# Patient Record
Sex: Male | Born: 1995 | Race: White | Hispanic: No | Marital: Single | Smoking: Never smoker
Health system: Southern US, Community
[De-identification: ages and names within clinical notes are randomized; demographics above are authoritative.]

## PROBLEM LIST (undated history)

## (undated) DIAGNOSIS — R55 Syncope and collapse: Secondary | ICD-10-CM

## (undated) DIAGNOSIS — I340 Nonrheumatic mitral (valve) insufficiency: Secondary | ICD-10-CM

## (undated) DIAGNOSIS — I341 Nonrheumatic mitral (valve) prolapse: Principal | ICD-10-CM

## (undated) DIAGNOSIS — R011 Cardiac murmur, unspecified: Secondary | ICD-10-CM

## (undated) DIAGNOSIS — Q676 Pectus excavatum: Secondary | ICD-10-CM

## (undated) DIAGNOSIS — R636 Underweight: Secondary | ICD-10-CM

## (undated) HISTORY — DX: Nonrheumatic mitral (valve) prolapse: I34.1

## (undated) HISTORY — DX: Syncope and collapse: R55

## (undated) HISTORY — PX: NO PAST SURGERIES: SHX2092

## (undated) HISTORY — DX: Cardiac murmur, unspecified: R01.1

## (undated) HISTORY — DX: Pectus excavatum: Q67.6

## (undated) HISTORY — DX: Nonrheumatic mitral (valve) insufficiency: I34.0

## (undated) HISTORY — DX: Underweight: R63.6

## (undated) NOTE — *Deleted (*Deleted)
Preventive Care 21-39 Years Old, Male Preventive care refers to lifestyle choices and visits with your health care provider that can promote health and wellness. This includes:  A yearly physical exam. This is also called an annual well check.  Regular dental and eye exams.  Immunizations.  Screening for certain conditions.  Healthy lifestyle choices, such as eating a healthy diet, getting regular exercise, not using drugs or products that contain nicotine and tobacco, and limiting alcohol use. What can I expect for my preventive care visit? Physical exam Your health care provider will check:  Height and weight. These may be used to calculate body mass index (BMI), which is a measurement that tells if you are at a healthy weight.  Heart rate and blood pressure.  Your skin for abnormal spots. Counseling Your health care provider may ask you questions about:  Alcohol, tobacco, and drug use.  Emotional well-being.  Home and relationship well-being.  Sexual activity.  Eating habits.  Work and work environment. What immunizations do I need?  Influenza (flu) vaccine  This is recommended every year. Tetanus, diphtheria, and pertussis (Tdap) vaccine  You may need a Td booster every 10 years. Varicella (chickenpox) vaccine  You may need this vaccine if you have not already been vaccinated. Human papillomavirus (HPV) vaccine  If recommended by your health care provider, you may need three doses over 6 months. Measles, mumps, and rubella (MMR) vaccine  You may need at least one dose of MMR. You may also need a second dose. Meningococcal conjugate (MenACWY) vaccine  One dose is recommended if you are 19-21 years old and a first-year college student living in a residence hall, or if you have one of several medical conditions. You may also need additional booster doses. Pneumococcal conjugate (PCV13) vaccine  You may need this if you have certain conditions and were not  previously vaccinated. Pneumococcal polysaccharide (PPSV23) vaccine  You may need one or two doses if you smoke cigarettes or if you have certain conditions. Hepatitis A vaccine  You may need this if you have certain conditions or if you travel or work in places where you may be exposed to hepatitis A. Hepatitis B vaccine  You may need this if you have certain conditions or if you travel or work in places where you may be exposed to hepatitis B. Haemophilus influenzae type b (Hib) vaccine  You may need this if you have certain risk factors. You may receive vaccines as individual doses or as more than one vaccine together in one shot (combination vaccines). Talk with your health care provider about the risks and benefits of combination vaccines. What tests do I need? Blood tests  Lipid and cholesterol levels. These may be checked every 5 years starting at age 20.  Hepatitis C test.  Hepatitis B test. Screening   Diabetes screening. This is done by checking your blood sugar (glucose) after you have not eaten for a while (fasting).  Sexually transmitted disease (STD) testing. Talk with your health care provider about your test results, treatment options, and if necessary, the need for more tests. Follow these instructions at home: Eating and drinking   Eat a diet that includes fresh fruits and vegetables, whole grains, lean protein, and low-fat dairy products.  Take vitamin and mineral supplements as recommended by your health care provider.  Do not drink alcohol if your health care provider tells you not to drink.  If you drink alcohol: ? Limit how much you have to 0-2   drinks a day. ? Be aware of how much alcohol is in your drink. In the [place] one drink equals one 12 oz bottle of beer (355 mL), one 5 oz glass of wine (148 mL), or one 1 oz glass of hard liquor (44 mL). Lifestyle  Take daily care of your teeth and gums.  Stay active. Exercise for at least 30 minutes on 5 or  more days each week.  Do not use any products that contain nicotine or tobacco, such as cigarettes, e-cigarettes, and chewing tobacco. If you need help quitting, ask your health care provider.  If you are sexually active, practice safe sex. Use a condom or other form of protection to prevent STIs (sexually transmitted infections). What's next?  Go to your health care provider once a year for a well check visit.  Ask your health care provider how often you should have your eyes and teeth checked.  Stay up to date on all vaccines. This information is not intended to replace advice given to you by your health care provider. Make sure you discuss any questions you have with your health care provider. Document Revised: 04/04/2018 Document Reviewed: 04/04/2018 Elsevier Patient Education  2020 [employer]  

---
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
## 2016-02-22 ENCOUNTER — Ambulatory Visit (INDEPENDENT_AMBULATORY_CARE_PROVIDER_SITE_OTHER): Payer: 59 | Admitting: Family Medicine

## 2016-02-22 ENCOUNTER — Ambulatory Visit (INDEPENDENT_AMBULATORY_CARE_PROVIDER_SITE_OTHER): Payer: 59

## 2016-02-22 DIAGNOSIS — M25532 Pain in left wrist: Secondary | ICD-10-CM | POA: Diagnosis not present

## 2016-02-22 DIAGNOSIS — M778 Other enthesopathies, not elsewhere classified: Secondary | ICD-10-CM

## 2016-02-22 MED ORDER — DICLOFENAC SODIUM 1 % TD GEL: 2.0000 g | Freq: Four times a day (QID) | TRANSDERMAL | 11 refills | Status: DC

## 2016-02-22 NOTE — Patient Instructions (Signed)
Thank you for coming in today. Attend PT/OT.  Work on exercises.  Use a brace as needed.  Apply the gel as needed for pain.    Flexor Proofreader Radialis Tendinitis With Rehab Tendonitis is a condition that involves inflammation of a tendon, which is a soft tissue that connects muscle to bone. The flexor carpi ulnaris (FCU) and flexor carpi radialis (FCR) tendons are vulnerable to tendonitis. The FCU and FCR are used for bending the wrist and gripping. FCU and FCR tendonitis may include inflammation of the tendon lining (sheath). The lining normally secretes fluid that allows the tendons to function smoothly, but when it is inflamed, function is impaired. This condition may also include a tear in the FCU or FCR tendon or muscle (strain). The strain may be classified as grade 1 or 2. Grade 1 strains cause pain, but the tendon is not lengthened. Grade 2 strains include a lengthened ligament, due to the ligament being stretched or partially torn. With grade 2 strains, there is still function, although function may be decreased. SYMPTOMS   Pain, tenderness, swelling, warmth, or redness on the underside of the wrist.  Pain that gets worse when bending the wrist, especially against resistance or when turning the palm down against resistance.  Pain with gripping.  Limited motion of the wrist.  Crackling sound (crepitation) when the tendon or wrist is moved or touched.  Numbness in part of the palm of the hand. CAUSES  FCU and FCR tendonitis are caused by injury to the FCU and FCR tendons. This is often due to chronic or repeated injuries, but may also be due to severe (acute) injury. Common causes of injury include:   Strain from unusual use, overuse, increase in activity, or change in activity of the wrist, hand, or forearm.  Repeated motions of the hand and wrist, due to friction of the tendon within the lining (sheath). RISK INCREASES WITH:   Sports that involve repeated  hand and wrist motions (i.e. golfing, bowling).  Sports that require gripping (i.e. tennis, golf, weightlifting).  Heavy labor.  Poor wrist and forearm strength and flexibility.  Failure to warm up properly before activity.  This condition is more common in women than in men. PREVENTION   Warm up and stretch properly before activity.  Allow the body to recover between activity.  Maintain physical fitness:  Strength, flexibility, and endurance.  Cardiovascular fitness.  Learn and use proper technique. PROGNOSIS  If treated properly, FCU and FCR tendonitis is usually curable with 6 weeks.  RELATED COMPLICATIONS   Longer healing time, if not properly treated or if not given adequate time to heal.  Chronically inflamed tendon, causing persistent pain with activity, that may progress to constant pain, restriction of motion of the tendon within the sheath, and may potentially rupture the tendon.  Recurring symptoms, especially if activity is resumed too soon.  Risks of surgery: infection, bleeding, injury to nerves, continued pain, incomplete release of the tendon lining, recurring symptoms, cutting of the tendon, and weakness of the wrist and grip. TREATMENT  Treatment first involves the use of ice and medicine to reduce pain and inflammation Performing stretching and strengthening exercises regularly is important for a quick recovery. These exercises may be completed at home or with a therapist. Your caregiver may recommend the use of a brace or splint to reduce motions that aggravate symptoms. Corticosteroid injections may be recommended. If non-surgical treatment is unsuccessful, surgery may be necessary.  MEDICATION  If pain medicine is needed, nonsteroidal anti-inflammatory medicines (aspirin and ibuprofen), or other minor pain relievers (acetaminophen), are often advised.  Do not take pain medicine for 7 days before surgery.  Prescription pain relievers may be given if  your caregiver thinks they are needed. Use only as directed and only as much as you need.  Cortisone injections may be given, to reduce inflammation. However, these injections should be reserved for serious cases, as they may only be given a certain number of times. COLD THERAPY  Cold treatment (icing) relieves pain and reduces inflammation. Cold treatment should be applied for 10 to 15 minutes every 2 to 3 hours, and immediately after activity that aggravates your symptoms. Use ice packs or an ice massage. SEEK MEDICAL CARE IF:   Symptoms get worse or do not improve in 2 weeks, despite treatment.  You experience pain, numbness, or coldness in the hand.  Blue, gray, or dark color appears in the fingernails.  Any of the following occur after surgery: increased pain, swelling, redness, drainage of fluids, bleeding in the affected area, or signs of infection.  New, unexplained symptoms develop. (Drugs used in treatment may produce side effects.) EXERCISES  RANGE OF MOTION (ROM) AND STRETCHING EXERCISES - Flexor Carpi Ulnaris and Flexor Carpi Radialis Tendinitis These exercises may help you when beginning to recover from your injury. Your symptoms may go away with or without further involvement from your physician, physical therapist or athletic trainer. While completing these exercises, remember:   Restoring tissue flexibility helps normal motion to return to the joints. This allows healthier, less painful movement and activity.  An effective stretch should be held for at least 30 seconds.  A stretch should never be painful. You should only feel a gentle lengthening or release in the stretched tissue. RANGE OF MOTION - Wrist Flexion, Active-Assisted  Extend your right / left elbow with your fingers pointing down.*  Gently pull the back of your hand towards you, until you feel a gentle stretch on the top of your forearm.  Hold this position for __________ seconds. Repeat __________ times.  Complete this exercise __________ times per day.  *If directed by your physician, physical therapist or athletic trainer, complete this stretch with your elbow bent, rather than extended. RANGE OF MOTION - Wrist Extension, Active-Assisted  Extend your right / left elbow and turn your palm upwards.*  Gently pull your palm and fingertips back, so your wrist extends and your fingers point more toward the ground.  You should feel a gentle stretch on the inside of your forearm.  Hold this position for __________ seconds. Repeat __________ times. Complete this exercise __________ times per day. *If directed by your physician, physical therapist or athletic trainer, complete this stretch with your elbow bent, rather than extended. STRETCH - Wrist Flexion   Place the back of your right / left hand on a tabletop, leaving your elbow slightly bent. Your fingers should point away from your body.  Gently press the back of your hand down onto the table by straightening your elbow. You should feel a stretch on the top of your forearm.  Hold this position for __________ seconds. Repeat __________ times. Complete this stretch __________ times per day.  STRETCH - Wrist Extension  Place your right / left fingertips on a tabletop leaving your elbow slightly bent. Your fingers should point backwards.  Gently press your fingers and palm down onto the table by straightening your elbow. You should feel a stretch on the inside  of your forearm.  Hold this position for __________ seconds. Repeat __________ times. Complete this stretch __________ times per day.  STRENGTHENING EXERCISES - Flexor Carpi Ulnaris and Flexor Carpi Radialis Tendinitis These exercises may help you when beginning to recover from your injury. They may resolve your symptoms with or without further involvement from your physician, physical therapist or athletic trainer. While completing these exercises, remember:   Muscles can gain both the  endurance and the strength needed for everyday activities through controlled exercises.  Complete these exercises as instructed by your physician, physical therapist or athletic trainer. Increase the resistance and repetitions only as guided.  You may experience muscle soreness or fatigue, but the pain or discomfort you are trying to eliminate should never worsen during these exercises. If this pain does worsen, stop and make certain you are following the directions exactly. If the pain is still present after adjustments, discontinue the exercise until you can discuss the trouble with your clinician. STRENGTH - Wrist Flexors  Sit with your right / left forearm palm-up and fully supported on a table. Your elbow should be resting below the height of your shoulder. Allow your wrist to extend over the edge of the surface.  Loosely holding a __________ weight, or holding a piece of rubber exercise band or tubing in both hands, slowly curl your hand up toward your forearm.  Hold this position for __________ seconds. Slowly lower the wrist back to the starting position, in a controlled manner. Repeat __________ times. Complete this exercise __________ times per day.  STRENGTH - Wrist Extensors  Sit with your right / left forearm palm-down and fully supported on a table. Your elbow should be resting below the height of your shoulder Allow your wrist to extend over the edge of the surface.  Loosely holding a __________ weight, or holding a piece of rubber exercise band or tubing in both hands, slowly curl your hand up toward your forearm.  Hold this position for __________ seconds. Slowly lower the wrist back to the starting position, in a controlled manner. Repeat __________ times. Complete this exercise __________ times per day.  STRENGTH - Ulnar Deviators  Stand with a ____________________ weight in your right / left hand, or sit while holding onto a rubber exercise band or tubing in both hands,  with your healthy arm supported on a table, and your injured arm below the table.  Move only your right / left wrist, so that your pinkie travels toward your forearm and your thumb moves away from your forearm.  Hold this position for __________ seconds and then slowly lower the wrist back to the starting position. Repeat __________ times. Complete this exercise __________ times per day STRENGTH - Radial Deviators  Stand with a ____________________ weight in your right / left hand, or sit while holding onto a rubber exercise band or tubing in both hands, with your right / left arm supported on a table, and your healthy arm below the table.  Raise your right / left hand upward in front of you, so that your thumb moves toward your forearm.  Hold this position for __________ seconds and then slowly lower the wrist back to the starting position. Repeat __________ times. Complete this exercise __________ times per day. STRENGTH - Grip   Grasp a tennis ball, a dense sponge, or a large, rolled sock in your hand.  Squeeze as hard as you can, without increasing any pain.  Hold this position for __________ seconds. Release your grip slowly.  Repeat __________ times. Complete this exercise __________ times per day.    This information is not intended to replace advice given to you by your health care provider. Make sure you discuss any questions you have with your health care provider.   Document Released: 04/10/2005 Document Revised: 05/01/2014 Document Reviewed: 07/23/2008 Elsevier Interactive Patient Education [employer].

## 2016-02-23 NOTE — Progress Notes (Signed)
Eric Wright is a 20 y.o. male who [facility] today for wrist pain. 2 years ago patient was having any wrist mobility competition with his brother and with his wrist in a significantly flexed position felt a pop. Since then he has had pain in the ulnar side of his wrist off and on. Recently the pain has been worsening. A few days ago he was unable to carry a cup without pain. He notes ulnar wrist pain and occasional popping and catching. Catching is pretty rare. He has used over-the-counter medications and wrist bracing to moderate success in the past.    No past medical history on file. No past surgical history on file. Social History  Substance Use Topics  . Smoking status: Not on file  . Smokeless tobacco: Not on file  . Alcohol use Not on file     ROS:  As above No headache, visual changes, nausea, vomiting, diarrhea, constipation, dizziness, abdominal pain, skin rash, fevers, chills, night sweats, weight loss, swollen lymph nodes, body aches, joint swelling, muscle aches, chest pain, shortness of breath, mood changes, visual or auditory hallucinations.    Medications: Current Outpatient Prescriptions  Medication Sig Dispense Refill  . diclofenac sodium (VOLTAREN) 1 % GEL Apply 2 g topically 4 (four) times daily. To affected joint. 100 g 11   No current facility-administered medications for this visit.    Not on File   Exam:  BP 109/64   Pulse (!) 102   Wt 116 lb (52.6 kg)  General: Well Developed, well nourished, and in no acute distress.  Neuro/Psych: Alert and oriented x3, extra-ocular muscles intact, able to move all 4 extremities, sensation grossly intact. Skin: Warm and dry, no rashes noted.  Respiratory: Not using accessory muscles, speaking in full sentences, trachea midline.  Cardiovascular: Pulses palpable, no extremity edema. Abdomen: Does not appear distended. MSK: Wrist Is  normal-appearing. Mildly tender at the ulnar aspect of the wrist. Normal wrist motion. Pain with ulnar deviation and resisted flexion and extension with ulnar deviation. Pulses capillary Refill sensation and grip strength intact.    No results found for this or any previous visit (from the past 48 hour(s)). Dg Wrist Complete Left  Result Date: 02/22/2016 CLINICAL DATA:  LEFT wrist pain for 1.5 weeks, ulnar side, no known injury EXAM: LEFT WRIST - COMPLETE 3+ VIEW COMPARISON:  Non FINDINGS: Osseous mineralization normal. Joint spaces preserved. No fracture, dislocation, or bone destruction. IMPRESSION: Normal exam. Electronically Signed   By: Ulyses SouthwardMark  Boles M.D.   On: 02/22/2016 17:33      Assessment and Plan: 20 y.o. male with wrist pain due to old injury. I'm concerned for TFCC injury versus tendinitis or subluxation of the flexor carpi ulnaris or extensor carpi ulnaris tendons. Additionally a radiographically occult bony injury could also be present. Plan for trial of diclofenac gel and hand therapy. Recheck in 4-6 weeks. If no better would proceed with MRI arthrogram.    Orders Placed This Encounter  Procedures  . DG Wrist Complete Left    Standing Status:   Future    Number of Occurrences:   1    Standing Expiration Date:   04/23/2017    Order Specific Question:   Reason for Exam (SYMPTOM  OR DIAGNOSIS REQUIRED)    Answer:   eval ulnar wrist pain    Order Specific Question:   Preferred imaging location?    [facility]  . Ambulatory referral  to Occupational Therapy    Referral Priority:   Routine    Referral Type:   Occupational Therapy    Referral Reason:   Specialty Services Required    Requested Specialty:   Occupational Therapy    Number of Visits Requested:   1  . Ambulatory referral to Physical Therapy    Referral Priority:   Routine    Referral Type:   Physical Medicine    Referral Reason:   Specialty Services Required    Requested Specialty:    Physical Therapy    Number of Visits Requested:   1    Discussed warning signs or symptoms. Please see discharge instructions. Patient expresses understanding.

## 2016-07-27 ENCOUNTER — Encounter: Payer: Self-pay | Admitting: Physician Assistant

## 2016-07-27 ENCOUNTER — Ambulatory Visit (INDEPENDENT_AMBULATORY_CARE_PROVIDER_SITE_OTHER): Payer: BLUE CROSS/BLUE SHIELD

## 2016-07-27 ENCOUNTER — Ambulatory Visit (INDEPENDENT_AMBULATORY_CARE_PROVIDER_SITE_OTHER): Payer: BLUE CROSS/BLUE SHIELD | Admitting: Physician Assistant

## 2016-07-27 VITALS — BP 121/68 | HR 99 | Temp 98.0°F | Ht 70.0 in | Wt 114.0 lb

## 2016-07-27 DIAGNOSIS — R011 Cardiac murmur, unspecified: Secondary | ICD-10-CM

## 2016-07-27 DIAGNOSIS — R636 Underweight: Secondary | ICD-10-CM

## 2016-07-27 DIAGNOSIS — Q676 Pectus excavatum: Secondary | ICD-10-CM

## 2016-07-27 DIAGNOSIS — J069 Acute upper respiratory infection, unspecified: Secondary | ICD-10-CM

## 2016-07-27 DIAGNOSIS — R05 Cough: Secondary | ICD-10-CM | POA: Diagnosis not present

## 2016-07-27 HISTORY — DX: Pectus excavatum: Q67.6

## 2016-07-27 HISTORY — DX: Underweight: R63.6

## 2016-07-27 LAB — COMPREHENSIVE METABOLIC PANEL
ALK PHOS: 81 U/L (ref 40–115)
ALT: 35 U/L (ref 9–46)
AST: 30 U/L (ref 10–40)
Albumin: 4.2 g/dL (ref 3.6–5.1)
BILIRUBIN TOTAL: 0.5 mg/dL (ref 0.2–1.2)
BUN: 11 mg/dL (ref 7–25)
CO2: 30 mmol/L (ref 20–31)
CREATININE: 0.83 mg/dL (ref 0.60–1.35)
Calcium: 9.2 mg/dL (ref 8.6–10.3)
Chloride: 101 mmol/L (ref 98–110)
GLUCOSE: 84 mg/dL (ref 65–99)
Potassium: 4.3 mmol/L (ref 3.5–5.3)
Sodium: 137 mmol/L (ref 135–146)
TOTAL PROTEIN: 7.7 g/dL (ref 6.1–8.1)

## 2016-07-27 LAB — CBC
HCT: 44.9 % (ref 38.5–50.0)
Hemoglobin: 15.8 g/dL (ref 13.2–17.1)
MCH: 30.7 pg (ref 27.0–33.0)
MCHC: 35.2 g/dL (ref 32.0–36.0)
MCV: 87.4 fL (ref 80.0–100.0)
MPV: 9 fL (ref 7.5–12.5)
PLATELETS: 293 10*3/uL (ref 140–400)
RBC: 5.14 MIL/uL (ref 4.20–5.80)
RDW: 12.8 % (ref 11.0–15.0)
WBC: 12.3 10*3/uL — AB (ref 3.8–10.8)

## 2016-07-27 LAB — TSH: TSH: 1.44 mIU/L (ref 0.40–4.50)

## 2016-07-27 MED ORDER — FLUTICASONE PROPIONATE 50 MCG/ACT NA SUSP: 2.0000 | Freq: Every day | NASAL | 1 refills | Status: DC

## 2016-07-27 MED ORDER — DOXYCYCLINE HYCLATE 100 MG PO TABS: 100.0000 mg | ORAL_TABLET | Freq: Two times a day (BID) | ORAL | 0 refills | Status: AC

## 2016-07-27 NOTE — Patient Instructions (Addendum)
-   Go downstairs for chest xray and labs  - Take antibiotic with food twice a day for 7 days - Use nasal spray twice a day for 7 days. Continue use as needed for nasal congestion and ear discomfort  To use your nasal spray: - tilt head back - block one nostril - aim away from your nasal septum - dispense and gently inhale (DO NOT SNIFF) - wait 5 seconds - repeat on other side   - They will call you to schedule your ECHO of your heart

## 2016-07-27 NOTE — Progress Notes (Signed)
HPI:                                                                Eric Wright is a 21 y.o. male who presents to [facility]: Primary Care Sports Medicine today to establish care   Current Concerns include sinus congestion and cough  Patient reports sinus congestion and cough x 1 month. Cough is productive of thick, yellow mucus. Denies fever, chills, malaise, SOB, DOE, wheezing. He has been taking Mucinex. Denies history of asthma or allergies. Nonsmoker.  Health Maintenance Health Maintenance  Topic Date Due  . TETANUS/TDAP  07/12/2014  . INFLUENZA VACCINE  11/22/2016  . HIV Screening  Completed     Health Habits  Diet: good  Exercise: boxing x 2-3 h  ETOH: none  Tobacco: none  Drugs: none  Past Medical History:  Diagnosis Date  . Heart murmur    Past Surgical History:  Procedure Laterality Date  . NO PAST SURGERIES     Social History  Substance Use Topics  . Smoking status: Never Smoker  . Smokeless tobacco: Never Used  . Alcohol use Yes     Comment: 1-2 drinks per month   family history includes Asthma in his brother.  ROS: negative except as noted in the HPI  Medications: Current Outpatient Prescriptions  Medication Sig Dispense Refill  . doxycycline (VIBRA-TABS) 100 MG tablet Take 1 tablet (100 mg total) by mouth 2 (two) times daily. 14 tablet 0  . fluticasone (FLONASE) 50 MCG/ACT nasal spray Place 2 sprays into both nostrils daily. 1 g 1   No current facility-administered medications for this visit.    No Known Allergies     Objective:  BP 121/68   Pulse 99   Temp 98 F (36.7 C) (Oral)   Ht  (1.778 m)   Wt 114 lb (51.7 kg)   SpO2 97%   BMI 16.36 kg/m  Gen: well-groomed, cooperative, not ill-appearing, no distress HEENT: normal conjunctiva, left TM retracted, oropharynx with thick, yellow post-nasal secretions, moist mucus membranes, there is cervical adenopathy Pulm: Normal work of breathing,  normal phonation, lungs clear to auscultation bilaterally, no wheezes, rales or rhonchi CV: chest wall concave, normal rate, regular rhythm, s1 and s2 distinct, grade 3/6 systolic murmur appreciated best in the left 3-4 ICS without radiation, there is a precordial heave Neuro: alert and oriented x 3, EOM's intact, normal tone, no tremor MSK: moving all extremities, normal gait and station, no peripheral edema Skin: warm and dry, no rashes or lesions on exposed skin Psych: normal affect, euthymic mood, normal speech and thought content  Depression screen [facility] 2/9 07/31/2016  Decreased Interest 0  Down, Depressed, Hopeless 0  PHQ - 2 Score 0     Assessment and Plan: 21 y.o. male with   1. Congenital pectus excavatum - DG Chest 2View  2. Acute upper respiratory infection of multiple sites - DG Chest 2 View - doxycycline (VIBRA-TABS) 100 MG tablet; Take 1 tablet (100 mg total) by mouth 2 (two) times daily.  Dispense: 14 tablet; Refill: 0 - fluticasone (FLONASE) 50 MCG/ACT nasal spray; Place 2 sprays into both nostrils daily.  Dispense: 1 g; Refill: 1  3. Underweight, Encounter for preventive care - CBC, CMP, TSH,  Vit D, HIV antibody  4. Systolic ejection murmur - Echocardiogram pending - I reviewed patient's records in Care Everywhere. Patient was evaluated by peds cardiology in 2012 for syncope and abnormal ECG. 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 Given that patient has likely had a growth spurt since that time, will repeat ECHO with his adult anatomy to ensure so worsening regurgitation  Patient education and anticipatory guidance given Patient agrees with treatment plan Follow-up as needed if symptoms worsen or fail to improve  Levonne Hubert PA-C

## 2016-07-28 LAB — HIV ANTIBODY (ROUTINE TESTING W REFLEX): HIV 1&2 Ab, 4th Generation: NONREACTIVE

## 2016-07-28 LAB — VITAMIN D 25 HYDROXY (VIT D DEFICIENCY, FRACTURES): Vit D, 25-Hydroxy: 28 ng/mL — ABNORMAL LOW (ref 30–100)

## 2016-07-31 ENCOUNTER — Encounter: Payer: Self-pay | Admitting: Physician Assistant

## 2016-07-31 DIAGNOSIS — R55 Syncope and collapse: Secondary | ICD-10-CM | POA: Insufficient documentation

## 2016-07-31 HISTORY — DX: Syncope and collapse: R55

## 2016-08-10 ENCOUNTER — Ambulatory Visit (HOSPITAL_BASED_OUTPATIENT_CLINIC_OR_DEPARTMENT_OTHER)
Admission: RE | Admit: 2016-08-10 | Discharge: 2016-08-10 | Disposition: A | Discharge status: Home / Self Care | Payer: BLUE CROSS/BLUE SHIELD | Source: Ambulatory Visit | Attending: Physician Assistant | Admitting: Physician Assistant

## 2016-08-10 DIAGNOSIS — R011 Cardiac murmur, unspecified: Secondary | ICD-10-CM | POA: Insufficient documentation

## 2016-08-10 NOTE — Progress Notes (Signed)
  Echocardiogram 2D Echocardiogram has been performed.  Leta Jungling M 08/10/2016, 11:07 AM

## 2016-08-11 ENCOUNTER — Other Ambulatory Visit: Payer: Self-pay | Admitting: Physician Assistant

## 2016-08-11 DIAGNOSIS — I340 Nonrheumatic mitral (valve) insufficiency: Secondary | ICD-10-CM | POA: Insufficient documentation

## 2016-08-11 DIAGNOSIS — I341 Nonrheumatic mitral (valve) prolapse: Secondary | ICD-10-CM | POA: Insufficient documentation

## 2016-08-11 DIAGNOSIS — R931 Abnormal findings on diagnostic imaging of heart and coronary circulation: Secondary | ICD-10-CM | POA: Insufficient documentation

## 2016-08-11 HISTORY — DX: Nonrheumatic mitral (valve) prolapse: I34.1

## 2016-08-11 HISTORY — DX: Nonrheumatic mitral (valve) insufficiency: I34.0

## 2016-08-11 NOTE — Progress Notes (Signed)
Referring to Cardiology for abnormal echocardiogram showing mild MR with mildly elevated pulmonary arterial pressure (31 mmHg) and a trivial pericardial effusion

## 2016-09-08 NOTE — Progress Notes (Deleted)
    Referring-Cummings, Bernerd Phoharley Elizabeth, PA-C Reason for referral-MVP  HPI: 21 year old male for evaluation of mitral valve prolapse at request of Carlis StableCharley Elizabeth Cummings, PA-C. [facility] normal LV function and mild tricuspid regurgitation. Echocardiogram April 2018 showed normal LV systolic function, moderate prolapse of the anterior mitral valve leaflet with mild mitral regurgitation.  Current Outpatient Prescriptions  Medication Sig Dispense Refill  . fluticasone (FLONASE) 50 MCG/ACT nasal spray Place 2 sprays into both nostrils daily. 1 g 1   No current facility-administered medications for this visit.     No Known Allergies  Past Medical History:  Diagnosis Date  . Heart murmur     Past Surgical History:  Procedure Laterality Date  . NO PAST SURGERIES      Social History   Social History  . Marital status: Single    Spouse name: N/A  . Number of children: N/A  . Years of education: N/A   Occupational History  . Not on file.   Social History Main Topics  . Smoking status: Never Smoker  . Smokeless tobacco: Never Used  . Alcohol use Yes     Comment: 1-2 drinks per month  . Drug use: No  . Sexual activity: Yes   Other Topics Concern  . Not on file   Social History Narrative  . No narrative on file    Family History  Problem Relation Age of Onset  . Asthma Brother     ROS: no fevers or chills, productive cough, hemoptysis, dysphasia, odynophagia, melena, hematochezia, dysuria, hematuria, rash, seizure activity, orthopnea, PND, pedal edema, claudication. Remaining systems are negative.  Physical Exam:   There were no vitals taken for this visit.  General:  Well developed/well nourished in NAD Skin warm/dry Patient not depressed No peripheral clubbing Back-normal HEENT-normal/normal eyelids Neck supple/normal carotid upstroke bilaterally; no bruits; no JVD; no thyromegaly chest - CTA/ normal expansion CV -  RRR/normal S1 and S2; no murmurs, rubs or gallops;  PMI nondisplaced Abdomen -NT/ND, no HSM, no mass, + bowel sounds, no bruit 2+ femoral pulses, no bruits Ext-no edema, chords, 2+ DP Neuro-grossly nonfocal  ECG - personally reviewed  A/P  1  Olga MillersBrian Crenshaw, MD

## 2016-09-13 ENCOUNTER — Ambulatory Visit: Payer: BLUE CROSS/BLUE SHIELD | Admitting: Cardiology

## 2016-11-09 ENCOUNTER — Ambulatory Visit (INDEPENDENT_AMBULATORY_CARE_PROVIDER_SITE_OTHER): Payer: BLUE CROSS/BLUE SHIELD | Admitting: Physician Assistant

## 2016-11-09 ENCOUNTER — Encounter: Payer: Self-pay | Admitting: Physician Assistant

## 2016-11-09 VITALS — BP 119/73 | HR 68 | Wt 115.0 lb

## 2016-11-09 DIAGNOSIS — I341 Nonrheumatic mitral (valve) prolapse: Secondary | ICD-10-CM | POA: Diagnosis not present

## 2016-11-09 DIAGNOSIS — I340 Nonrheumatic mitral (valve) insufficiency: Secondary | ICD-10-CM

## 2016-11-09 NOTE — Progress Notes (Signed)
HPI:                                                                Eric Wright is a 21 y.o. male who [facility] today for follow-up of heart murmur  Patient presents today to review his recent Chest X-ray and Echocardiogram. Echo showed MVP with mild mitral regurgitation. Pulmonary pressure was also mildly elevated at 31. Patient was referred to Cardiology, but he missed his appointment. He is currently asymptomatic. Denies palpitations, irregular heart beat or shortness of breath.   Past Medical History:  Diagnosis Date  . 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  . Heart murmur   . Mild mitral regurgitation by prior echocardiogram 08/11/2016  . Mitral valve prolapse determined by imaging 08/11/2016  . Underweight 07/27/2016  . Vasovagal syncope 07/31/2016   Past Surgical History:  Procedure Laterality Date  . NO PAST SURGERIES     Social History  Substance Use Topics  . Smoking status: Never Smoker  . Smokeless tobacco: Never Used  . Alcohol use Yes     Comment: 1-2 drinks per month   family history includes Asthma in his brother.  ROS: negative except as noted in the HPI  Medications: No current outpatient prescriptions on file.   No current facility-administered medications for this visit.    No Known Allergies     Objective:  BP 119/73   Pulse 68   Wt 115 lb (52.2 kg)   BMI 16.50 kg/m  Gen: well-groomed, cooperative, not ill-appearing, no distress HEENT: normal conjunctiva, trachea midline Pulm: Normal work of breathing, normal phonation, clear to auscultation bilaterally, no wheezes, rales or rhonchi CV: chest wall with pectus excavatum, normal rate, regular rhythm, s1 and s2 distinct, grade 3/6 systolic murmur appreciated best in the left 3-4 ICS without radiation, there is a precordial heave Neuro: alert and oriented x 3, EOM's intact, no tremor MSK: extremities atraumatic, normal gait and  station, no peripheral edema Skin: warm, dry, intact; no rashes on exposed skin, no cyanosis   No results found for this or any previous visit (from the past 72 hour(s)). No results found.    Assessment and Plan: 21 y.o. male with   1. Mitral valve prolapse determined by imaging - asymptomatic - explained the physiology of MVP to patient and answer - Ambulatory referral to Cardiology  2. Mild mitral regurgitation by prior echocardiogram - discussed that this will likely require surveillance echo in 3-5 years - Ambulatory referral to Cardiology  Patient education and anticipatory guidance given Patient agrees with treatment plan Follow-up as needed if symptoms worsen or fail to improve  Levonne Hubertharley E. Cummings PA-C

## 2016-11-09 NOTE — Patient Instructions (Signed)
Mitral Valve Prolapse Mitral valve prolapse is a heart condition involving the mitral valve. This is the valve between the upper chamber (atrium) and the lower chamber (ventricle) on the left side of the heart. Normally, the mitral valve allows blood to flow from the atrium to the ventricle and then seals off the chambers from one another. If you have mitral valve prolapse, the valve does not work the way that it should. The flaps of the mitral valve do not form a tight seal between the atrium and the ventricle. When this happens, blood can flow the wrong way (mitral valve regurgitation). This causes symptoms of mitral valve prolapse. This condition can develop if:  The mitral valve flaps are larger and thicker than normal.  The valve opening stretches abnormally.  The valve flaps flop or bulge more than they should.  What are the causes? The cause of this condition is not known. However:  It is sometimes passed down (inherited) from a family member who also had the condition.  It can be a complication of other diseases.  What increases the risk? This condition is more like to develop in people with:  A family history of mitral valve prolapse.  Muscular dystrophy.  Graves disease.  Scoliosis.  A connective tissue disorder.  What are the signs or symptoms? Symptoms of this condition include:  A fast or irregular heartbeat (palpitations).  Fatigue.  Dizziness.  Shortness of breath.  Discomfort in the chest area.  In some cases, there are no symptoms for this condition. How is this diagnosed? This condition may be diagnosed based on:  Your symptoms and medical history.  A physical exam, which includes listening to your heart with a stethoscope.  Other tests to confirm the diagnosis. These may include imaging studies of your heart, such as: ? X-rays. These check for fluid in the lungs. ? Echocardiogram. This test uses sound waves to show the size of your heart and  how well it pumps. ? Doppler ultrasound. This test uses sound waves to take pictures of the blood flow through your valve. ? Electrocardiogram (ECG). This test records the electrical activity of your heart.  How is this treated? Treatment for this condition depends on how severe your symptoms are. If you need treatment, the goal is to relieve symptoms and prevent further problems, such as a type of heart infection called endocarditis. Treatment can include:  Medicines. You may need to take: ? Beta blockers. These help with chest discomfort and palpitations. ? Vasodilators. These improve forward blood flow through the valve, if it is leaking. ? Water pills (diuretics). These get rid of any extra fluid that is present.  Surgery to repair or replace the mitral valve.  If you do not have symptoms, you may not need treatment. Follow these instructions at home:  Take over-the-counter and prescription medicines only as told by your health care provider.  Get regular exercise. Ask your health care provider to recommend some activities that are safe for you to do.  Do not use any products that contain nicotine or tobacco, such as cigarettes and e-cigarettes. If you need help quitting, ask your health care provider.  Avoid being around secondhand smoke.  Brush and floss your teeth every day. See a dentist regularly. Having unhealthy teeth and gums may make endocarditis more likely.  Keep all follow-up visits as told by your health care provider. This is important. Contact a health care provider if:  You have any kind of abnormal heartbeat.    You are often very tired.  You have a cough that will not go away.  Your symptoms of mitral valve prolapse begin to get worse. Get help right away if:  You have chest pain or shortness of breath.  You start to have chills, body aches, and a fever. This information is not intended to replace advice given to you by your health care provider. Make  sure you discuss any questions you have with your health care provider. Document Released: 04/07/2000 Document Revised: 12/07/2015 Document Reviewed: 10/02/2015 Elsevier Interactive Patient Education  2017 Elsevier Inc[employer]  

## 2017-01-05 ENCOUNTER — Encounter: Payer: Self-pay | Admitting: Cardiology

## 2017-01-05 NOTE — Progress Notes (Deleted)
    Referring-Charley Doran Heater PA-C Reason for referral-abnormal echocardiogram/mitral valve prolapse  HPI: 21 year old male for evaluation of abnormal echocardiogram/mitral valve prolapse at request of Carlis Stable PA-C. Echocardiogram April 2018 showed normal LV function, moderate prolapse of anterior mitral valve leaflet with mild mitral regurgitation and mildly elevated pulmonary pressure.  No current outpatient prescriptions on file.   No current facility-administered medications for this visit.     No Known Allergies  Past Medical History:  Diagnosis Date  . 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  . Heart murmur   . Mild mitral regurgitation by prior echocardiogram 08/11/2016  . Mitral valve prolapse determined by imaging 08/11/2016  . Underweight 07/27/2016  . Vasovagal syncope 07/31/2016    Past Surgical History:  Procedure Laterality Date  . NO PAST SURGERIES      Social History   Social History  . Marital status: Single    Spouse name: N/A  . Number of children: N/A  . Years of education: N/A   Occupational History  . Not on file.   Social History Main Topics  . Smoking status: Never Smoker  . Smokeless tobacco: Never Used  . Alcohol use Yes     Comment: 1-2 drinks per month  . Drug use: No  . Sexual activity: Yes   Other Topics Concern  . Not on file   Social History Narrative  . No narrative on file    Family History  Problem Relation Age of Onset  . Asthma Brother     ROS: no fevers or chills, productive cough, hemoptysis, dysphasia, odynophagia, melena, hematochezia, dysuria, hematuria, rash, seizure activity, orthopnea, PND, pedal edema, claudication. Remaining systems are negative.  Physical Exam:   There were no vitals taken for this visit.  General:  Well developed/well nourished in NAD Skin warm/dry Patient not depressed No peripheral clubbing Back-normal HEENT-normal/normal eyelids Neck supple/normal carotid  upstroke bilaterally; no bruits; no JVD; no thyromegaly chest - CTA/ normal expansion CV - RRR/normal S1 and S2; no murmurs, rubs or gallops;  PMI nondisplaced Abdomen -NT/ND, no HSM, no mass, + bowel sounds, no bruit 2+ femoral pulses, no bruits Ext-no edema, chords, 2+ DP Neuro-grossly nonfocal  ECG - personally reviewed  A/P  1  Olga Millers, MD

## 2017-01-17 ENCOUNTER — Ambulatory Visit: Payer: BLUE CROSS/BLUE SHIELD | Admitting: Cardiology

## 2017-02-08 NOTE — Progress Notes (Signed)
    Referring-Cummings, Bernerd Phoharley Elizabeth, PA-C Reason for referral-MVR/MR  HPI: 21 year old male for evaluation of mitral valve prolapse/mitral regurgitation at request of Mardelle MatteCharlie Elizabeth Cummings PA-C. Echo 4/18 showed normal LV function, prolapse of anterior MV leaflet with mild MR; mildly elevated pulmonary pressure. Patient denies dyspnea, chest pain or recent syncope. He did have syncopal episodes in the remote past but not since 7 grade Occasional brief palpitations described as a skip. Because of the above we were asked to evaluate.  No current outpatient prescriptions on file.   No current facility-administered medications for this visit.     No Known Allergies   Past Medical History:  Diagnosis Date  . 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  . Heart murmur   . Mild mitral regurgitation by prior echocardiogram 08/11/2016  . Mitral valve prolapse determined by imaging 08/11/2016  . Underweight 07/27/2016  . Vasovagal syncope 07/31/2016    Past Surgical History:  Procedure Laterality Date  . NO PAST SURGERIES      Social History   Social History  . Marital status: Single    Spouse name: N/A  . Number of children: N/A  . Years of education: N/A   Occupational History  .      Works at retirement home   Social History Main Topics  . Smoking status: Never Smoker  . Smokeless tobacco: Never Used  . Alcohol use Yes     Comment: 1-2 drinks per month  . Drug use: No  . Sexual activity: Yes   Other Topics Concern  . Not on file   Social History Narrative  . No narrative on file    Family History  Problem Relation Age of Onset  . Asthma Brother     ROS: no fevers or chills, productive cough, hemoptysis, dysphasia, odynophagia, melena, hematochezia, dysuria, hematuria, rash, seizure activity, orthopnea, PND, pedal edema, claudication. Remaining systems are negative.  Physical Exam:   Blood pressure 102/66, pulse 77, height 5\' 10"  (1.778 m), weight 117 lb (53.1  kg).  General:  Well developed/well nourished in NAD Skin warm/dry Patient not depressed No peripheral clubbing Back-normal HEENT-normal/normal eyelids Neck supple/normal carotid upstroke bilaterally; no bruits; no JVD; no thyromegaly chest - CTA/ normal expansion; pectus excavatum CV - RRR/normal S1 and S2; no rubs or gallops;  PMI nondisplaced; 2/6 systolic murmur LSB Abdomen -NT/ND, no HSM, no mass, + bowel sounds, no bruit 2+ femoral pulses, no bruits Ext-no edema, chords, 2+ DP Neuro-grossly nonfocal  ECG - sinus rhythm at a rate of 77. Right axis deviation. No ST changes. personally reviewed  A/P  1 mitral valve prolapse/mitral regurgitation-patient has mild mitral regurgitation and mitral valve prolapse on prior echocardiogram. I reviewed this issue with him in detail today to clinic. We will plan follow-up echocardiogram in 3-5 years or if symptoms change.  2 remote history of syncope-none since seventh grade. No further evaluation.  3 history of pectus excavatum  Olga MillersBrian Roger Kettles, MD

## 2017-02-13 ENCOUNTER — Encounter: Payer: Self-pay | Admitting: Cardiology

## 2017-02-13 ENCOUNTER — Ambulatory Visit (INDEPENDENT_AMBULATORY_CARE_PROVIDER_SITE_OTHER): Payer: BLUE CROSS/BLUE SHIELD | Admitting: Cardiology

## 2017-02-13 VITALS — BP 102/66 | HR 77 | Ht 70.0 in | Wt 117.0 lb

## 2017-02-13 DIAGNOSIS — I341 Nonrheumatic mitral (valve) prolapse: Secondary | ICD-10-CM | POA: Diagnosis not present

## 2017-02-13 DIAGNOSIS — I340 Nonrheumatic mitral (valve) insufficiency: Secondary | ICD-10-CM | POA: Diagnosis not present

## 2017-02-13 DIAGNOSIS — R55 Syncope and collapse: Secondary | ICD-10-CM | POA: Diagnosis not present

## 2017-02-13 NOTE — Patient Instructions (Signed)
Your physician wants you to follow-up in: ONE YEAR WITH DR CRENSHAW IN [place] You will receive a reminder letter in the mail two months in advance. If you don't receive a letter, please call our office to schedule the follow-up appointment.   If you need a refill on your cardiac medications before your next appointment, please call your pharmacy.  

## 2017-05-02 IMAGING — DX DG WRIST COMPLETE 3+V*L*
4 series · 4 of 4 positions shown · non-contrast
Comparison: Non

CLINICAL DATA: LEFT wrist pain for 1.5 weeks, ulnar side, no known
injury

EXAM:
LEFT WRIST - COMPLETE 3+ VIEW

[wrist pa]
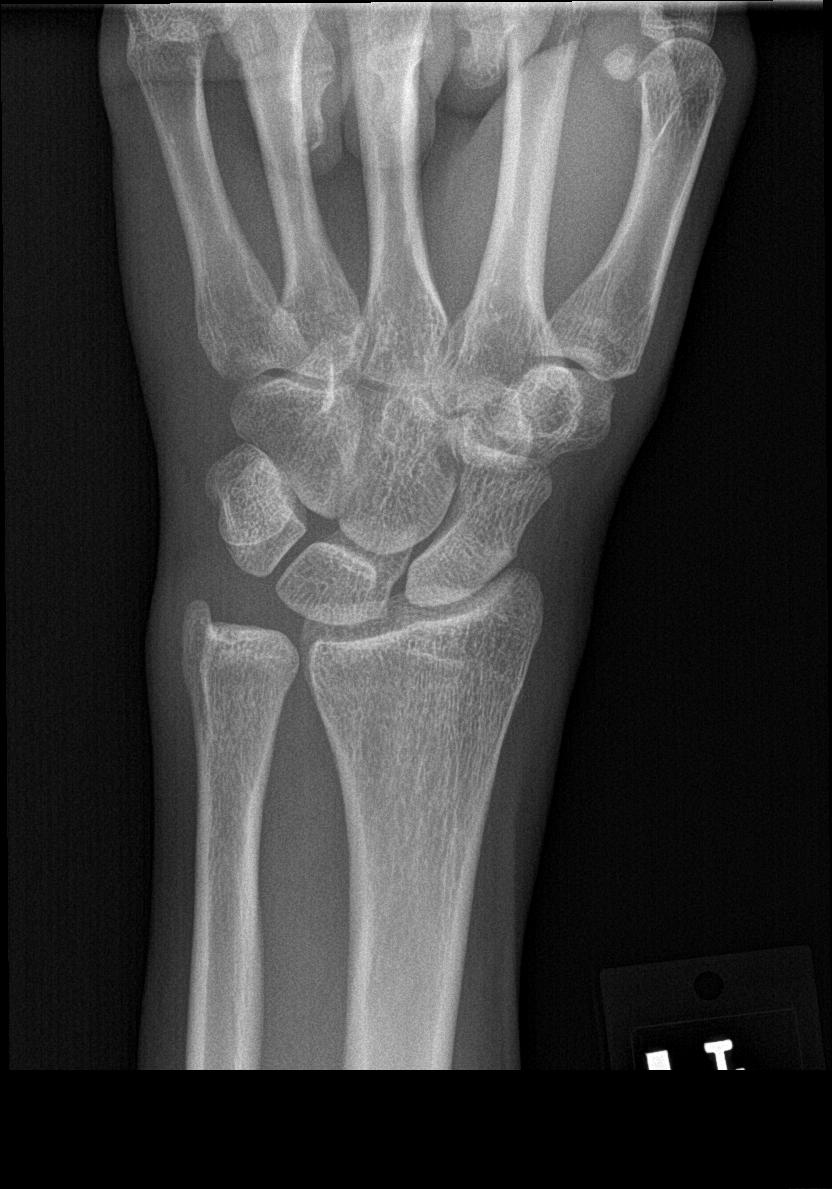

[wrist obl]
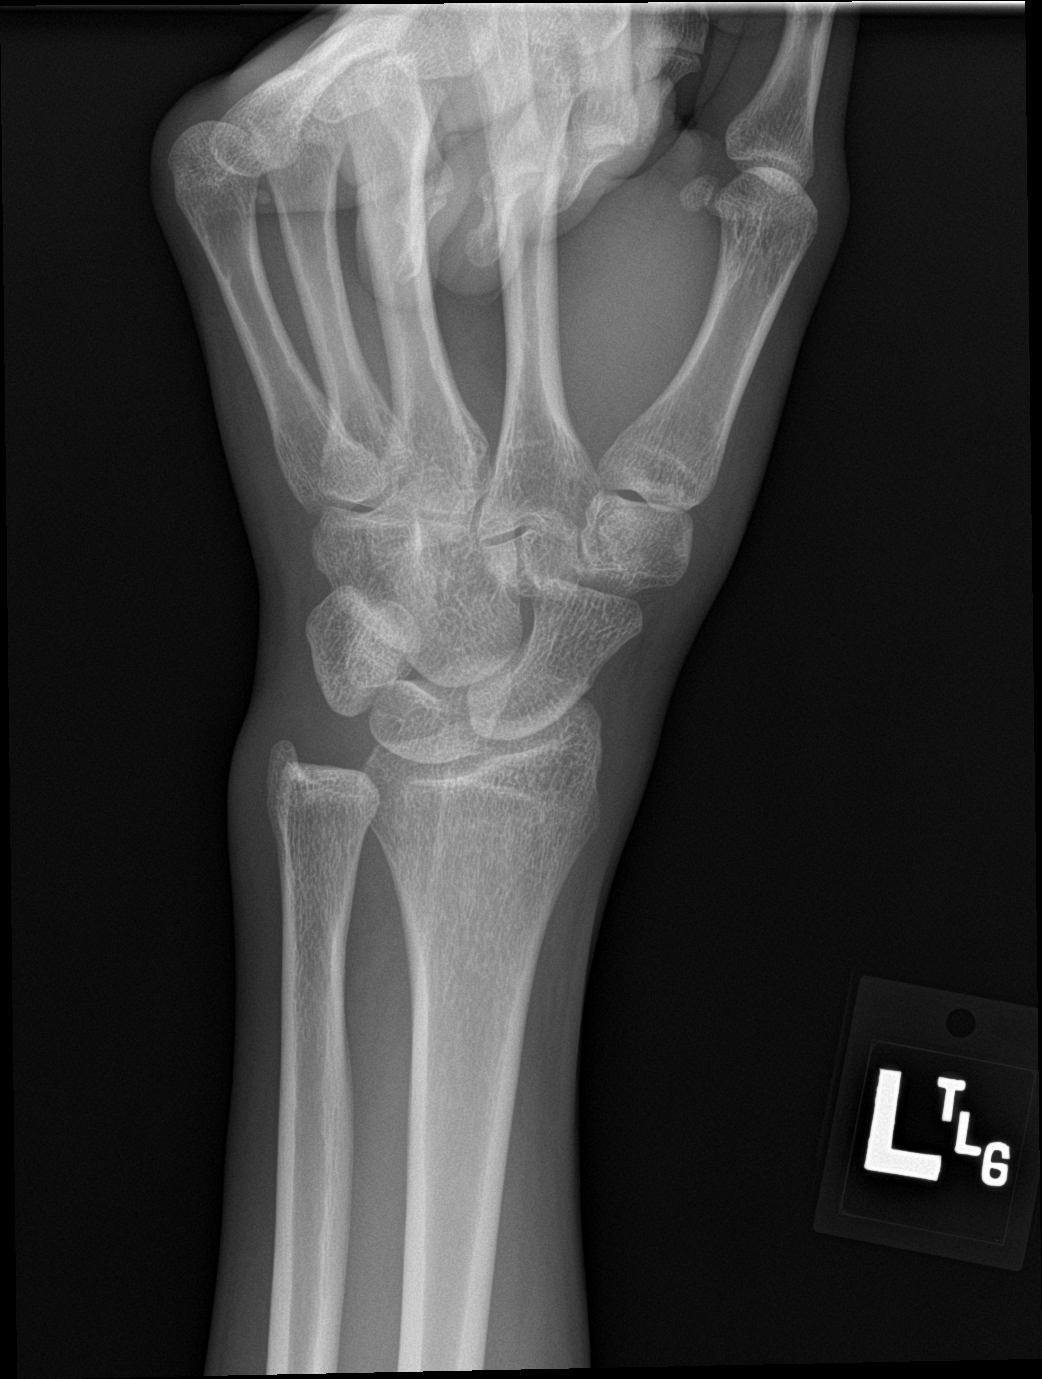

[wrist lat]
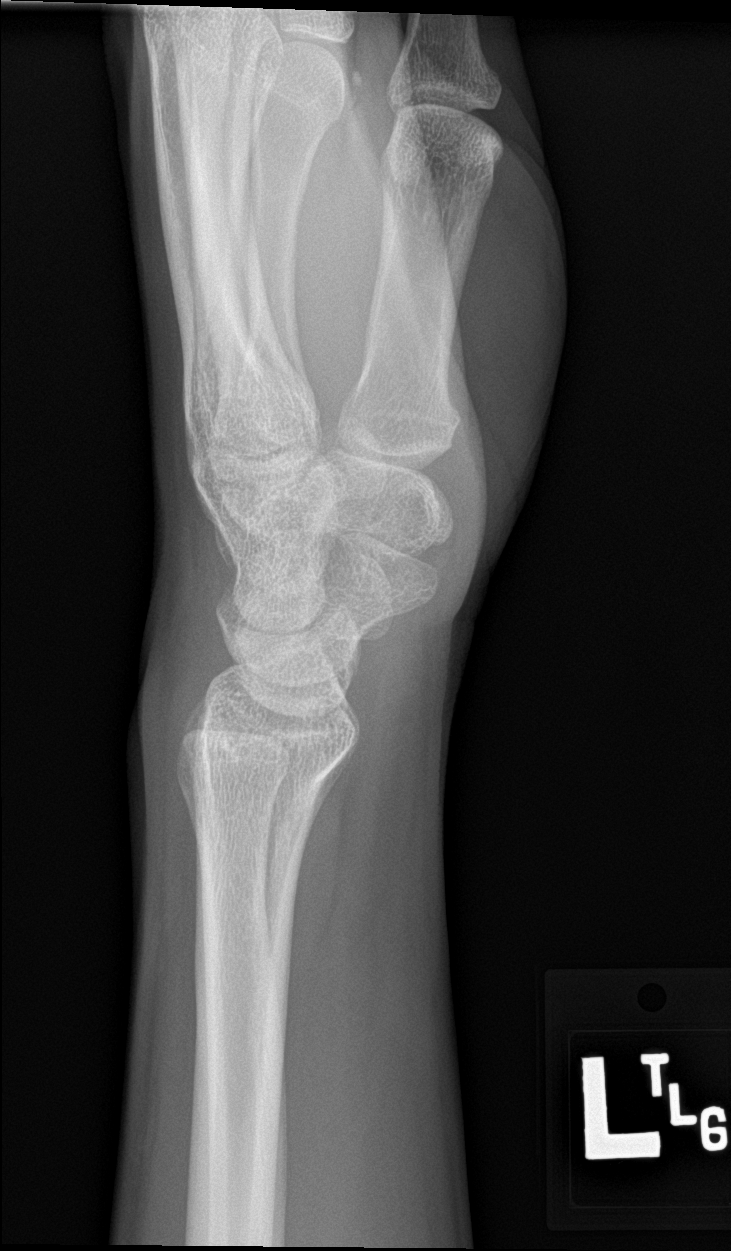

[wrist navicular]
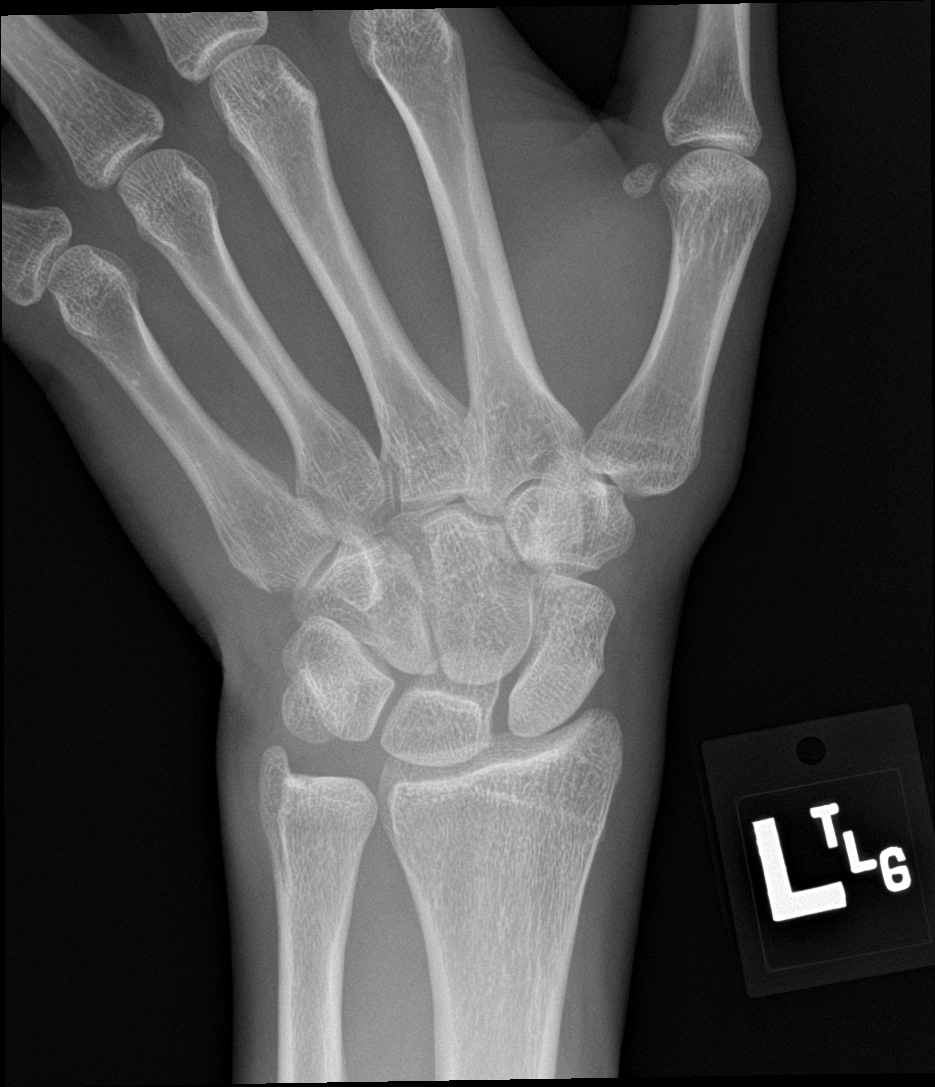

[4 of 4 positions shown; findings below may reference images not displayed]

FINDINGS: Osseous mineralization normal.

Joint spaces preserved.

No fracture, dislocation, or bone destruction.
IMPRESSION: Normal exam.

## 2020-02-24 NOTE — Progress Notes (Deleted)
HPI: Eric Wright is a 24 y.o. male who  has a past medical history of Congenital pectus excavatum (07/27/2016), Heart murmur, Mild mitral regurgitation by prior echocardiogram (08/11/2016), Mitral valve prolapse determined by imaging (08/11/2016), Underweight (07/27/2016), and Vasovagal syncope (07/31/2016).  he presents to [facility] today, 02/24/20,  for chief complaint of: Annual physical exam  Dentist:  Eye exam: Exercise: Diet: Colon cancer screening:NA Prostate cancer screening: NA COVID vaccine:  Concerns:  Past medical, surgical, social and family history reviewed:  Patient Active Problem List   Diagnosis Date Noted  . Mitral valve prolapse determined by imaging 08/11/2016  . Mild mitral regurgitation by prior echocardiogram 08/11/2016  . Abnormal echocardiogram 08/11/2016  . Vasovagal syncope 07/31/2016  . 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  . Underweight 07/27/2016  . Systolic ejection murmur 07/27/2016  . Left wrist pain 02/22/2016  . Left wrist tendonitis 02/22/2016    Past Surgical History:  Procedure Laterality Date  . NO PAST SURGERIES      Social History   Tobacco Use  . Smoking status: Never Smoker  . Smokeless tobacco: Never Used  Substance Use Topics  . Alcohol use: Yes    Comment: 1-2 drinks per month    Family History  Problem Relation Age of Onset  . Asthma Brother      Current medication list and allergy/intolerance information reviewed:    No current outpatient medications on file.   No current facility-administered medications for this visit.    No Known Allergies    Review of Systems:  Constitutional:  No  fever, no chills, No recent illness, No unintentional weight changes. No significant fatigue.   HEENT: No  headache, no vision change, no hearing change, No sore throat, No  sinus pressure  Cardiac: No  chest pain, No  pressure, No palpitations, No  Orthopnea  Respiratory:   No  shortness of breath. No  Cough  Gastrointestinal: No  abdominal pain, No  nausea, No  vomiting,  No  blood in stool, No  diarrhea, No  constipation   Musculoskeletal: No new myalgia/arthralgia  Skin: No  Rash, No other wounds/concerning lesions  Genitourinary: No  incontinence, No  abnormal genital bleeding, No abnormal genital discharge  Hem/Onc: No  easy bruising/bleeding, No  abnormal lymph node  Endocrine: No cold intolerance,  No heat intolerance. No polyuria/polydipsia/polyphagia   Neurologic: No  weakness, No  dizziness, No  slurred speech/focal weakness/facial droop  Psychiatric: No  concerns with depression, No  concerns with anxiety, No sleep problems, No mood problems  Exam:  There were no vitals taken for this visit.  Constitutional: VS see above. General Appearance: alert, well-developed, well-nourished, NAD  Eyes: Normal lids and conjunctive, non-icteric sclera  Ears, Nose, Mouth, Throat: MMM, Normal external inspection ears/nares/mouth/lips/gums. TM normal bilaterally. Pharynx/tonsils no erythema, no exudate. Nasal mucosa normal.   Neck: No masses, trachea midline. No thyroid enlargement. No tenderness/mass appreciated. No lymphadenopathy  Respiratory: Normal respiratory effort. no wheeze, no rhonchi, no rales  Cardiovascular: S1/S2 normal, no murmur, no rub/gallop auscultated. RRR. No lower extremity edema. Pedal pulse II/IV bilaterally DP and PT. No carotid bruit or JVD. No abdominal aortic bruit.  Gastrointestinal: Nontender, no masses. No hepatomegaly, no splenomegaly. No hernia appreciated. Bowel sounds normal. Rectal exam deferred.   Musculoskeletal: Gait normal. No clubbing/cyanosis of digits.   Neurological: Normal balance/coordination. No tremor. No cranial nerve deficit on limited exam. Motor and sensation intact and symmetric. Cerebellar reflexes intact.   Skin: warm,  dry, intact. No rash/ulcer. No concerning nevi or subq nodules on limited exam.      Psychiatric: Normal judgment/insight. Normal mood and affect. Oriented x3.    No results found for this or any previous visit (from the past 72 hour(s)).  No results found.   ASSESSMENT/PLAN:   1. Encounter to establish care ***  2. Annual physical exam ***   No orders of the defined types were placed in this encounter.   No orders of the defined types were placed in this encounter.   Patient Instructions  Preventive Care 42-61 Years Old, Male Preventive care refers to lifestyle choices and visits with your health care provider that can promote health and wellness. This includes:  A yearly physical exam. This is also called an annual well check.  Regular dental and eye exams.  Immunizations.  Screening for certain conditions.  Healthy lifestyle choices, such as eating a healthy diet, getting regular exercise, not using drugs or products that contain nicotine and tobacco, and limiting alcohol use. What can I expect for my preventive care visit? Physical exam Your health care provider will check:  Height and weight. These may be used to calculate body mass index (BMI), which is a measurement that tells if you are at a healthy weight.  Heart rate and blood pressure.  Your skin for abnormal spots. Counseling Your health care provider may ask you questions about:  Alcohol, tobacco, and drug use.  Emotional well-being.  Home and relationship well-being.  Sexual activity.  Eating habits.  Work and work Statistician. What immunizations do I need?  Influenza (flu) vaccine  This is recommended every year. Tetanus, diphtheria, and pertussis (Tdap) vaccine  You may need a Td booster every 10 years. Varicella (chickenpox) vaccine  You may need this vaccine if you have not already been vaccinated. Human papillomavirus (HPV) vaccine  If recommended by your health care provider, you may need three doses over 6 months. Measles, mumps, and rubella (MMR)  vaccine  You may need at least one dose of MMR. You may also need a second dose. Meningococcal conjugate (MenACWY) vaccine  One dose is recommended if you are 72-64 years old and a Market researcher living in a residence hall, or if you have one of several medical conditions. You may also need additional booster doses. Pneumococcal conjugate (PCV13) vaccine  You may need this if you have certain conditions and were not previously vaccinated. Pneumococcal polysaccharide (PPSV23) vaccine  You may need one or two doses if you smoke cigarettes or if you have certain conditions. Hepatitis A vaccine  You may need this if you have certain conditions or if you travel or work in places where you may be exposed to hepatitis A. Hepatitis B vaccine  You may need this if you have certain conditions or if you travel or work in places where you may be exposed to hepatitis B. Haemophilus influenzae type b (Hib) vaccine  You may need this if you have certain risk factors. You may receive vaccines as individual doses or as more than one vaccine together in one shot (combination vaccines). Talk with your health care provider about the risks and benefits of combination vaccines. What tests do I need? Blood tests  Lipid and cholesterol levels. These may be checked every 5 years starting at age 36.  Hepatitis C test.  Hepatitis B test. Screening   Diabetes screening. This is done by checking your blood sugar (glucose) after you have not eaten for a  while (fasting).  Sexually transmitted disease (STD) testing. Talk with your health care provider about your test results, treatment options, and if necessary, the need for more tests. Follow these instructions at home: Eating and drinking   Eat a diet that includes fresh fruits and vegetables, whole grains, lean protein, and low-fat dairy products.  Take vitamin and mineral supplements as recommended by your health care provider.  Do not  drink alcohol if your health care provider tells you not to drink.  If you drink alcohol: ? Limit how much you have to 0-2 drinks a day. ? Be aware of how much alcohol is in your drink. In the [place] one drink equals one 12 oz bottle of beer (355 mL), one 5 oz glass of wine (148 mL), or one 1 oz glass of hard liquor (44 mL). Lifestyle  Take daily care of your teeth and gums.  Stay active. Exercise for at least 30 minutes on 5 or more days each week.  Do not use any products that contain nicotine or tobacco, such as cigarettes, e-cigarettes, and chewing tobacco. If you need help quitting, ask your health care provider.  If you are sexually active, practice safe sex. Use a condom or other form of protection to prevent STIs (sexually transmitted infections). What's next?  Go to your health care provider once a year for a well check visit.  Ask your health care provider how often you should have your eyes and teeth checked.  Stay up to date on all vaccines. This information is not intended to replace advice given to you by your health care provider. Make sure you discuss any questions you have with your health care provider. Document Revised: 04/04/2018 Document Reviewed: 04/04/2018 Elsevier Patient Education  [place].    Follow-up plan: No follow-ups on file.  Clearnce Sorrel, DNP, APRN, FNP-BC [facility]

## 2020-02-25 ENCOUNTER — Ambulatory Visit: Payer: BLUE CROSS/BLUE SHIELD | Admitting: Medical-Surgical

## 2020-02-25 DIAGNOSIS — Z0000 Encounter for general adult medical examination without abnormal findings: Secondary | ICD-10-CM

## 2020-02-25 DIAGNOSIS — Z7689 Persons encountering health services in other specified circumstances: Secondary | ICD-10-CM

## 2020-03-08 ENCOUNTER — Encounter: Payer: Self-pay | Admitting: Medical-Surgical

## 2020-03-08 ENCOUNTER — Other Ambulatory Visit: Payer: Self-pay

## 2020-03-08 ENCOUNTER — Ambulatory Visit (INDEPENDENT_AMBULATORY_CARE_PROVIDER_SITE_OTHER): Payer: Self-pay | Admitting: Medical-Surgical

## 2020-03-08 VITALS — BP 99/63 | HR 66 | Temp 97.9°F | Ht 72.0 in | Wt 121.6 lb

## 2020-03-08 DIAGNOSIS — Z0000 Encounter for general adult medical examination without abnormal findings: Secondary | ICD-10-CM | POA: Diagnosis not present

## 2020-03-08 DIAGNOSIS — Z1159 Encounter for screening for other viral diseases: Secondary | ICD-10-CM | POA: Diagnosis not present

## 2020-03-08 DIAGNOSIS — Z1329 Encounter for screening for other suspected endocrine disorder: Secondary | ICD-10-CM | POA: Diagnosis not present

## 2020-03-08 DIAGNOSIS — Z7689 Persons encountering health services in other specified circumstances: Secondary | ICD-10-CM

## 2020-03-08 DIAGNOSIS — Z8349 Family history of other endocrine, nutritional and metabolic diseases: Secondary | ICD-10-CM | POA: Diagnosis not present

## 2020-03-08 DIAGNOSIS — R55 Syncope and collapse: Secondary | ICD-10-CM

## 2020-03-08 LAB — CBC
HCT: 48 % (ref 38.5–50.0)
Hemoglobin: 16.5 g/dL (ref 13.2–17.1)
MCHC: 34.4 g/dL (ref 32.0–36.0)
MCV: 89.2 fL (ref 80.0–100.0)
RDW: 12.4 % (ref 11.0–15.0)
WBC: 6.2 10*3/uL (ref 3.8–10.8)

## 2020-03-08 NOTE — Progress Notes (Signed)
HPI: Eric Wright is a 24 y.o. male who  has a past medical history of Congenital pectus excavatum (07/27/2016), Heart murmur, Mild mitral regurgitation by prior echocardiogram (08/11/2016), Mitral valve prolapse determined by imaging (08/11/2016), Underweight (07/27/2016), and Vasovagal syncope (07/31/2016).  he presents to [facility] today, 03/08/20,  for chief complaint of: Annual physical exam  Dentist: every 6 months, no concerns Eye exam: no correction, right eyea little dim sometimes Exercise: walks dog, usually 8-10k steps per day at work, hiking whenever possible Diet: tries to eat varied diet, skips breakfasts but eats large lunches and dinners COVID vaccine: None yet, has questions  Concerns:  History of heart murmur and vasovagal syncope. Had an episode at work recently where he felt nauseated and dizzy with tingling in the face. Laid down on the bathroom floor since he thought he was going to pass out. Has 1-2 spells a year, this one closer to the last one than usual. Feel weak during the spell. Gets shivers when anxious about the spell. Stayed in bed that day and the next.  Past medical, surgical, social and family history reviewed:  Patient Active Problem List   Diagnosis Date Noted  . Mitral valve prolapse determined by imaging 08/11/2016  . Mild mitral regurgitation by prior echocardiogram 08/11/2016  . Abnormal echocardiogram 08/11/2016  . Vasovagal syncope 07/31/2016  . 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  . Underweight 07/27/2016  . Systolic ejection murmur 68/34/1962  . Left wrist pain 02/22/2016  . Left wrist tendonitis 02/22/2016    Past Surgical History:  Procedure Laterality Date  . NO PAST SURGERIES      Social History   Tobacco Use  . Smoking status: Never Smoker  . Smokeless tobacco: Never Used  Substance Use Topics  . Alcohol use: Yes    Alcohol/week: 1.0 - 2.0 standard drink    Types: 1 - 2 Standard  drinks or equivalent per week    Family History  Problem Relation Age of Onset  . Asthma Brother   . Dementia Maternal Grandmother      Current medication list and allergy/intolerance information reviewed:    No current outpatient medications on file.   No current facility-administered medications for this visit.    No Known Allergies    Review of Systems:  Constitutional:  No  fever, no chills, No recent illness, No unintentional weight changes. No significant fatigue.   HEENT: No  headache, no vision change, no hearing change, No sore throat, No  sinus pressure  Cardiac: No  chest pain, No  pressure, No palpitations, No  Orthopnea  Respiratory:  No  shortness of breath. No  Cough  Gastrointestinal: No  abdominal pain, No  nausea, No  vomiting,  No  blood in stool, No  diarrhea, No  constipation   Musculoskeletal: No new myalgia/arthralgia  Skin: No  Rash, No other wounds/concerning lesions  Genitourinary: No  incontinence, No  abnormal genital bleeding, No abnormal genital discharge  Hem/Onc: No  easy bruising/bleeding, No  abnormal lymph node  Endocrine: No cold intolerance,  No heat intolerance. No polyuria/polydipsia/polyphagia   Neurologic: No  weakness, No  dizziness, No  slurred speech/focal weakness/facial droop  Psychiatric: No  concerns with depression, No  concerns with anxiety, No sleep problems, No mood problems  Exam:  BP 99/63   Pulse 66   Temp 97.9 F (36.6 C) (Oral)   Ht 6' (1.829 m)   Wt 121 lb 9.6 oz (55.2 kg)  SpO2 99%   BMI 16.49 kg/m   Constitutional: VS see above. General Appearance: alert, well-developed, NAD  Eyes: Normal lids and conjunctive, non-icteric sclera  Ears, Nose, Mouth, Throat: MMM, Normal external inspection ears/nares/mouth/lips/gums. TM normal bilaterally.   Neck: No masses, trachea midline. No thyroid enlargement. No tenderness/mass appreciated. No lymphadenopathy  Respiratory: Normal respiratory effort. no  wheeze, no rhonchi, no rales  Cardiovascular: S1/S2 normal, no murmur, no rub/gallop auscultated. RRR. No lower extremity edema. Pedal pulse II/IV bilaterally PT. No carotid bruit or JVD. No abdominal aortic bruit.  Gastrointestinal: Nontender, no masses. No hepatomegaly, no splenomegaly. No hernia appreciated. Bowel sounds normal. Rectal exam deferred.   Musculoskeletal: Gait normal. No clubbing/cyanosis of digits.   Neurological: Normal balance/coordination. No tremor. No cranial nerve deficit on limited exam. Motor and sensation intact and symmetric. Cerebellar reflexes intact.   Skin: warm, dry, intact. No rash/ulcer. No concerning nevi or subq nodules on limited exam.    Psychiatric: Normal judgment/insight. Normal mood and affect. Oriented x3.    No results found for this or any previous visit (from the past 72 hour(s)).  No results found.   ASSESSMENT/PLAN:   1. Encounter to establish care Reviewed available information discussed health care concerns with patient.  2. Annual physical exam Checking CBC, CMP, and lipid panel today. - CBC - COMPLETE METABOLIC PANEL WITH GFR - Lipid panel  3. Vasovagal syncope Recommend staying well-hydrated and monitoring for signs or symptoms.  If these episodes do become more frequent, may benefit from further evaluation/reevaluation.  4. Need for hepatitis C screening test Discussed screening recommendations.  Patient is agreeable so we will add this to blood work today. - Hepatitis C antibody  5. Screening for endocrine disorder/family history of thyroid disease Checking TSH today. - TSH  Orders Placed This Encounter  Procedures  . Hepatitis C antibody  . TSH  . CBC  . COMPLETE METABOLIC PANEL WITH GFR  . Lipid panel    No orders of the defined types were placed in this encounter.   Patient Instructions  Preventive Care 4-33 Years Old, Male Preventive care refers to lifestyle choices and visits with your health care  provider that can promote health and wellness. This includes:  A yearly physical exam. This is also called an annual well check.  Regular dental and eye exams.  Immunizations.  Screening for certain conditions.  Healthy lifestyle choices, such as eating a healthy diet, getting regular exercise, not using drugs or products that contain nicotine and tobacco, and limiting alcohol use. What can I expect for my preventive care visit? Physical exam Your health care provider will check:  Height and weight. These may be used to calculate body mass index (BMI), which is a measurement that tells if you are at a healthy weight.  Heart rate and blood pressure.  Your skin for abnormal spots. Counseling Your health care provider may ask you questions about:  Alcohol, tobacco, and drug use.  Emotional well-being.  Home and relationship well-being.  Sexual activity.  Eating habits.  Work and work Statistician. What immunizations do I need?  Influenza (flu) vaccine  This is recommended every year. Tetanus, diphtheria, and pertussis (Tdap) vaccine  You may need a Td booster every 10 years. Varicella (chickenpox) vaccine  You may need this vaccine if you have not already been vaccinated. Human papillomavirus (HPV) vaccine  If recommended by your health care provider, you may need three doses over 6 months. Measles, mumps, and rubella (MMR) vaccine  You  may need at least one dose of MMR. You may also need a second dose. Meningococcal conjugate (MenACWY) vaccine  One dose is recommended if you are 61-72 years old and a Market researcher living in a residence hall, or if you have one of several medical conditions. You may also need additional booster doses. Pneumococcal conjugate (PCV13) vaccine  You may need this if you have certain conditions and were not previously vaccinated. Pneumococcal polysaccharide (PPSV23) vaccine  You may need one or two doses if you smoke  cigarettes or if you have certain conditions. Hepatitis A vaccine  You may need this if you have certain conditions or if you travel or work in places where you may be exposed to hepatitis A. Hepatitis B vaccine  You may need this if you have certain conditions or if you travel or work in places where you may be exposed to hepatitis B. Haemophilus influenzae type b (Hib) vaccine  You may need this if you have certain risk factors. You may receive vaccines as individual doses or as more than one vaccine together in one shot (combination vaccines). Talk with your health care provider about the risks and benefits of combination vaccines. What tests do I need? Blood tests  Lipid and cholesterol levels. These may be checked every 5 years starting at age 57.  Hepatitis C test.  Hepatitis B test. Screening   Diabetes screening. This is done by checking your blood sugar (glucose) after you have not eaten for a while (fasting).  Sexually transmitted disease (STD) testing. Talk with your health care provider about your test results, treatment options, and if necessary, the need for more tests. Follow these instructions at home: Eating and drinking   Eat a diet that includes fresh fruits and vegetables, whole grains, lean protein, and low-fat dairy products.  Take vitamin and mineral supplements as recommended by your health care provider.  Do not drink alcohol if your health care provider tells you not to drink.  If you drink alcohol: ? Limit how much you have to 0-2 drinks a day. ? Be aware of how much alcohol is in your drink. In the [place] one drink equals one 12 oz bottle of beer (355 mL), one 5 oz glass of wine (148 mL), or one 1 oz glass of hard liquor (44 mL). Lifestyle  Take daily care of your teeth and gums.  Stay active. Exercise for at least 30 minutes on 5 or more days each week.  Do not use any products that contain nicotine or tobacco, such as cigarettes,  e-cigarettes, and chewing tobacco. If you need help quitting, ask your health care provider.  If you are sexually active, practice safe sex. Use a condom or other form of protection to prevent STIs (sexually transmitted infections). What's next?  Go to your health care provider once a year for a well check visit.  Ask your health care provider how often you should have your eyes and teeth checked.  Stay up to date on all vaccines. This information is not intended to replace advice given to you by your health care provider. Make sure you discuss any questions you have with your health care provider. Document Revised: 04/04/2018 Document Reviewed: 04/04/2018 Elsevier Patient Education  [place].  Follow-up plan: Return in about 1 year (around 03/08/2021) for annual physical exam or sooner if needed.  Clearnce Sorrel, DNP, APRN, FNP-BC [facility]

## 2020-03-08 NOTE — Patient Instructions (Signed)
Preventive Care 19-24 Years Old, Male Preventive care refers to lifestyle choices and visits with your health care provider that can promote health and wellness. This includes:  A yearly physical exam. This is also called an annual well check.  Regular dental and eye exams.  Immunizations.  Screening for certain conditions.  Healthy lifestyle choices, such as eating a healthy diet, getting regular exercise, not using drugs or products that contain nicotine and tobacco, and limiting alcohol use. What can I expect for my preventive care visit? Physical exam Your health care provider will check:  Height and weight. These may be used to calculate body mass index (BMI), which is a measurement that tells if you are at a healthy weight.  Heart rate and blood pressure.  Your skin for abnormal spots. Counseling Your health care provider may ask you questions about:  Alcohol, tobacco, and drug use.  Emotional well-being.  Home and relationship well-being.  Sexual activity.  Eating habits.  Work and work Statistician. What immunizations do I need?  Influenza (flu) vaccine  This is recommended every year. Tetanus, diphtheria, and pertussis (Tdap) vaccine  You may need a Td booster every 10 years. Varicella (chickenpox) vaccine  You may need this vaccine if you have not already been vaccinated. Human papillomavirus (HPV) vaccine  If recommended by your health care provider, you may need three doses over 6 months. Measles, mumps, and rubella (MMR) vaccine  You may need at least one dose of MMR. You may also need a second dose. Meningococcal conjugate (MenACWY) vaccine  One dose is recommended if you are 45-76 years old and a Market researcher living in a residence hall, or if you have one of several medical conditions. You may also need additional booster doses. Pneumococcal conjugate (PCV13) vaccine  You may need this if you have certain conditions and were not  previously vaccinated. Pneumococcal polysaccharide (PPSV23) vaccine  You may need one or two doses if you smoke cigarettes or if you have certain conditions. Hepatitis A vaccine  You may need this if you have certain conditions or if you travel or work in places where you may be exposed to hepatitis A. Hepatitis B vaccine  You may need this if you have certain conditions or if you travel or work in places where you may be exposed to hepatitis B. Haemophilus influenzae type b (Hib) vaccine  You may need this if you have certain risk factors. You may receive vaccines as individual doses or as more than one vaccine together in one shot (combination vaccines). Talk with your health care provider about the risks and benefits of combination vaccines. What tests do I need? Blood tests  Lipid and cholesterol levels. These may be checked every 5 years starting at age 17.  Hepatitis C test.  Hepatitis B test. Screening   Diabetes screening. This is done by checking your blood sugar (glucose) after you have not eaten for a while (fasting).  Sexually transmitted disease (STD) testing. Talk with your health care provider about your test results, treatment options, and if necessary, the need for more tests. Follow these instructions at home: Eating and drinking   Eat a diet that includes fresh fruits and vegetables, whole grains, lean protein, and low-fat dairy products.  Take vitamin and mineral supplements as recommended by your health care provider.  Do not drink alcohol if your health care provider tells you not to drink.  If you drink alcohol: ? Limit how much you have to 0-2  drinks a day. ? Be aware of how much alcohol is in your drink. In the [place] one drink equals one 12 oz bottle of beer (355 mL), one 5 oz glass of wine (148 mL), or one 1 oz glass of hard liquor (44 mL). Lifestyle  Take daily care of your teeth and gums.  Stay active. Exercise for at least 30 minutes on 5 or  more days each week.  Do not use any products that contain nicotine or tobacco, such as cigarettes, e-cigarettes, and chewing tobacco. If you need help quitting, ask your health care provider.  If you are sexually active, practice safe sex. Use a condom or other form of protection to prevent STIs (sexually transmitted infections). What's next?  Go to your health care provider once a year for a well check visit.  Ask your health care provider how often you should have your eyes and teeth checked.  Stay up to date on all vaccines. This information is not intended to replace advice given to you by your health care provider. Make sure you discuss any questions you have with your health care provider. Document Revised: 04/04/2018 Document Reviewed: 04/04/2018 Elsevier Patient Education  2020 [employer].

## 2020-03-09 LAB — CBC
MCH: 30.7 pg (ref 27.0–33.0)
MPV: 9.3 fL (ref 7.5–12.5)
Platelets: 282 10*3/uL (ref 140–400)
RBC: 5.38 10*6/uL (ref 4.20–5.80)

## 2020-03-09 LAB — COMPLETE METABOLIC PANEL WITH GFR
AG Ratio: 1.5 (calc) (ref 1.0–2.5)
ALT: 25 U/L (ref 9–46)
AST: 23 U/L (ref 10–40)
Albumin: 4.7 g/dL (ref 3.6–5.1)
Alkaline phosphatase (APISO): 48 U/L (ref 36–130)
BUN: 12 mg/dL (ref 7–25)
CO2: 29 mmol/L (ref 20–32)
Calcium: 9.9 mg/dL (ref 8.6–10.3)
Chloride: 100 mmol/L (ref 98–110)
Creat: 0.81 mg/dL (ref 0.60–1.35)
GFR, Est African American: 144 mL/min/{1.73_m2} (ref >=60)
GFR, Est Non African American: 124 mL/min/{1.73_m2} (ref >=60)
Globulin: 3.1 g/dL (calc) (ref 1.9–3.7)
Glucose, Bld: 77 mg/dL (ref 65–99)
Potassium: 4.6 mmol/L (ref 3.5–5.3)
Sodium: 137 mmol/L (ref 135–146)
Total Bilirubin: 0.9 mg/dL (ref 0.2–1.2)
Total Protein: 7.8 g/dL (ref 6.1–8.1)

## 2020-03-09 LAB — TSH: TSH: 1.17 mIU/L (ref 0.40–4.50)

## 2020-03-09 LAB — LIPID PANEL
Cholesterol: 131 mg/dL (ref <200)
HDL: 52 mg/dL (ref >=40)
LDL Cholesterol (Calc): 65 mg/dL (calc)
Non-HDL Cholesterol (Calc): 79 mg/dL (calc) (ref <130)
Total CHOL/HDL Ratio: 2.5 (calc) (ref <5.0)
Triglycerides: 67 mg/dL (ref <150)

## 2020-03-09 LAB — HEPATITIS C ANTIBODY
Hepatitis C Ab: NONREACTIVE
SIGNAL TO CUT-OFF: 0.03 (ref <1.00)

## 2020-03-10 ENCOUNTER — Telehealth: Payer: Self-pay

## 2020-03-10 DIAGNOSIS — R011 Cardiac murmur, unspecified: Secondary | ICD-10-CM

## 2020-03-10 DIAGNOSIS — I340 Nonrheumatic mitral (valve) insufficiency: Secondary | ICD-10-CM

## 2020-03-10 DIAGNOSIS — I341 Nonrheumatic mitral (valve) prolapse: Secondary | ICD-10-CM

## 2020-03-10 NOTE — Telephone Encounter (Signed)
Patient aware. No further questions or concerns at this time.   

## 2020-03-10 NOTE — Telephone Encounter (Signed)
Referral signed. They should reach out to get him scheduled. If he hasn't heard anything in the next 2 weeks, please let us know.

## 2020-03-10 NOTE — Telephone Encounter (Signed)
Pt is requesting a referral to Cardiology. He last saw Dr. Jens Som on 02/13/2017 and his Mom recommended that he get the referral so he can follow up with them.   Referral has been tee'd up below and is ready for review and approval/denial.

## 2020-04-22 NOTE — Progress Notes (Signed)
Referring-Eric Larinda Buttery, NP Reason for referral-mitral valve prolapse/mitral regurgitation  HPI: 24 year old male for evaluation of mitral valve prolapse/mitral regurgitation at request of Christen Butter, NP.  I have seen the pt previously but not since October 2018.  Echocardiogram April 2018 showed normal LV function, moderate prolapse of the anterior mitral valve leaflet with mild mitral regurgitation.  Patient states in October he was at work.  He developed nausea and went to the bathroom.  He then felt extremely fatigued and had near syncope.  This improved with lying on the floor.  There was no preceding chest pain, dyspnea, diaphoresis or palpitations.  He has had no further symptoms and did not have frank syncope.  He denies dyspnea on exertion, orthopnea, PND, pedal edema.  Occasional brief palpitations not sustained.  No current outpatient medications on file.   No current facility-administered medications for this visit.    No Known Allergies   Past Medical History:  Diagnosis Date  . 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  . Heart murmur   . Mild mitral regurgitation by prior echocardiogram 08/11/2016  . Mitral valve prolapse determined by imaging 08/11/2016  . Underweight 07/27/2016  . Vasovagal syncope 07/31/2016    Past Surgical History:  Procedure Laterality Date  . NO PAST SURGERIES      Social History   Socioeconomic History  . Marital status: Single    Spouse name: Not on file  . Number of children: Not on file  . Years of education: Not on file  . 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 Smoking status: Never Smoker  . Smokeless tobacco: Never Used  Vaping Use  . Vaping Use: Never used  Substance and Sexual Activity  . Alcohol use: Yes    Alcohol/week: 1.0 - 2.0 standard drink    Types: 1 - 2 Standard drinks or equivalent per week  . Drug use: No  . Sexual activity: Yes    Partners:  Female    Birth control/protection: None  Other Topics Concern  . Not on file  Social History Narrative  . Not on file   Social Determinants of Health   Financial Resource Strain: Not on file  Food Insecurity: Not on file  Transportation Needs: Not on file  Physical Activity: Not on file  Stress: Not on file  Social Connections: Not on file  Intimate Partner Violence: Not on file    Family History  Problem Relation Age of Onset  . Asthma Brother   . Dementia Maternal Grandmother     ROS: no fevers or chills, productive cough, hemoptysis, dysphasia, odynophagia, melena, hematochezia, dysuria, hematuria, rash, seizure activity, orthopnea, PND, pedal edema, claudication. Remaining systems are negative.  Physical Exam:   Weight 122 lb 1.9 oz (55.4 kg).  General:  Well developed/well nourished in NAD Skin warm/dry Patient not depressed No peripheral clubbing Back-normal HEENT-normal/normal eyelids Neck supple/normal carotid upstroke bilaterally; no bruits; no JVD; no thyromegaly chest - CTA/ normal expansion; pectus excavatum CV - RRR/normal S1 and S2; no murmurs, rubs or gallops;  PMI nondisplaced Abdomen -NT/ND, no HSM, no mass, + bowel sounds, no bruit 2+ femoral pulses, no bruits Ext-no edema, chords, 2+ DP Neuro-grossly nonfocal  ECG -sinus rhythm at a rate of 85, biatrial enlargement, no ST changes, right axis deviation.  Personally reviewed  A/P  1 mitral valve prolapse/mitral regurgitation-plan repeat echocardiogram.  Patient denies dyspnea.  2 presyncope-likely vagal in  etiology.  No further symptoms since October.  We discussed the importance of maintaining hydration and sodium intake.  Plan repeat echocardiogram to assess LV function.  3 history of pectus excavatum.  Olga Millers, MD

## 2020-04-28 ENCOUNTER — Encounter: Payer: Self-pay | Admitting: Cardiology

## 2020-04-28 ENCOUNTER — Other Ambulatory Visit: Payer: Self-pay

## 2020-04-28 ENCOUNTER — Ambulatory Visit (INDEPENDENT_AMBULATORY_CARE_PROVIDER_SITE_OTHER): Payer: BC Managed Care – PPO | Admitting: Cardiology

## 2020-04-28 VITALS — BP 118/58 | HR 71 | Ht 71.5 in | Wt 122.1 lb

## 2020-04-28 DIAGNOSIS — Q676 Pectus excavatum: Secondary | ICD-10-CM | POA: Diagnosis not present

## 2020-04-28 DIAGNOSIS — I340 Nonrheumatic mitral (valve) insufficiency: Secondary | ICD-10-CM | POA: Diagnosis not present

## 2020-04-28 DIAGNOSIS — I341 Nonrheumatic mitral (valve) prolapse: Secondary | ICD-10-CM

## 2020-04-28 DIAGNOSIS — R55 Syncope and collapse: Secondary | ICD-10-CM | POA: Diagnosis not present

## 2020-04-28 NOTE — Patient Instructions (Signed)
  Testing/Procedures:  Your physician has requested that you have an echocardiogram. Echocardiography is a painless test that uses sound waves to create images of your heart. It provides your doctor with information about the size and shape of your heart and how well your heart's chambers and valves are working. This procedure takes approximately one hour. There are no restrictions for this procedure.[address], [address] FLO[address] IMAGING DEPARTMENT     Follow-Up: At [facility], you and your health needs are our priority.  As part of our continuing mission to provide you with exceptional heart care, we have created designated Provider Care Teams.  These Care Teams include your primary Cardiologist (physician) and Advanced Practice Providers (APPs -  Physician Assistants and Nurse Practitioners) who all work together to provide you with the care you need, when you need it.  We recommend signing up for the patient portal called "MyChart".  Sign up information is provided on this After Visit Summary.  MyChart is used to connect with patients for Virtual Visits (Telemedicine).  Patients are able to view lab/test results, encounter notes, upcoming appointments, etc.  Non-urgent messages can be sent to your provider as well.   To learn more about what you can do with MyChart, go to [email or web address].    Your next appointment:   24 month(s)  The format for your next appointment:   In Person  Provider:   Olga Millers, MD

## 2020-05-03 ENCOUNTER — Ambulatory Visit (HOSPITAL_BASED_OUTPATIENT_CLINIC_OR_DEPARTMENT_OTHER)
Admission: RE | Admit: 2020-05-03 | Discharge: 2020-05-03 | Disposition: A | Discharge status: Home / Self Care | Payer: BC Managed Care – PPO | Source: Ambulatory Visit | Attending: Cardiology | Admitting: Cardiology

## 2020-05-03 ENCOUNTER — Other Ambulatory Visit: Payer: Self-pay

## 2020-05-03 DIAGNOSIS — I340 Nonrheumatic mitral (valve) insufficiency: Secondary | ICD-10-CM | POA: Diagnosis not present

## 2020-05-03 LAB — ECHOCARDIOGRAM COMPLETE
Area-P 1/2: 7.74 cm2
S' Lateral: 2.71 cm

## 2020-05-19 ENCOUNTER — Encounter: Payer: Self-pay | Admitting: *Deleted

## 2021-03-09 ENCOUNTER — Other Ambulatory Visit: Payer: Self-pay

## 2021-03-09 ENCOUNTER — Encounter: Payer: Self-pay | Admitting: Medical-Surgical

## 2021-03-09 ENCOUNTER — Ambulatory Visit (INDEPENDENT_AMBULATORY_CARE_PROVIDER_SITE_OTHER): Payer: BC Managed Care – PPO | Admitting: Medical-Surgical

## 2021-03-09 VITALS — BP 105/70 | HR 80 | Resp 20 | Ht 71.5 in | Wt 122.0 lb

## 2021-03-09 DIAGNOSIS — R21 Rash and other nonspecific skin eruption: Secondary | ICD-10-CM

## 2021-03-09 DIAGNOSIS — Z0000 Encounter for general adult medical examination without abnormal findings: Secondary | ICD-10-CM | POA: Diagnosis not present

## 2021-03-09 MED ORDER — CLOTRIMAZOLE-BETAMETHASONE 1-0.05 % EX CREA: 1.0000 "application " | TOPICAL_CREAM | Freq: Two times a day (BID) | CUTANEOUS | 0 refills | Status: DC

## 2021-03-09 NOTE — Patient Instructions (Signed)
Preventive Care 21-25 Years Old, Male ?Preventive care refers to lifestyle choices and visits with your health care provider that can promote health and wellness. Preventive care visits are also called wellness exams. ?What can I expect for my preventive care visit? ?Counseling ?During your preventive care visit, your health care provider may ask about your: ?Medical history, including: ?Past medical problems. ?Family medical history. ?Current health, including: ?Emotional well-being. ?Home life and relationship well-being. ?Sexual activity. ?Lifestyle, including: ?Alcohol, nicotine or tobacco, and drug use. ?Access to firearms. ?Diet, exercise, and sleep habits. ?Safety issues such as seatbelt and bike helmet use. ?Sunscreen use. ?Work and work environment. ?Physical exam ?Your health care provider may check your: ?Height and weight. These may be used to calculate your BMI (body mass index). BMI is a measurement that tells if you are at a healthy weight. ?Waist circumference. This measures the distance around your waistline. This measurement also tells if you are at a healthy weight and may help predict your risk of certain diseases, such as type 2 diabetes and high blood pressure. ?Heart rate and blood pressure. ?Body temperature. ?Skin for abnormal spots. ?What immunizations do I need? ?Vaccines are usually given at various ages, according to a schedule. Your health care provider will recommend vaccines for you based on your age, medical history, and lifestyle or other factors, such as travel or where you work. ?What tests do I need? ?Screening ?Your health care provider may recommend screening tests for certain conditions. This may include: ?Lipid and cholesterol levels. ?Diabetes screening. This is done by checking your blood sugar (glucose) after you have not eaten for a while (fasting). ?Hepatitis B test. ?Hepatitis C test. ?HIV (human immunodeficiency virus) test. ?STI (sexually transmitted infection)  testing, if you are at risk. ?Talk with your health care provider about your test results, treatment options, and if necessary, the need for more tests. ?Follow these instructions at home: ?Eating and drinking ? ?Eat a healthy diet that includes fresh fruits and vegetables, whole grains, lean protein, and low-fat dairy products. ?Drink enough fluid to keep your urine pale yellow. ?Take vitamin and mineral supplements as recommended by your health care provider. ?Do not drink alcohol if your health care provider tells you not to drink. ?If you drink alcohol: ?Limit how much you have to 0-2 drinks a day. ?Know how much alcohol is in your drink. In the [place] one drink equals one 12 oz bottle of beer (355 mL), one 5 oz glass of wine (148 mL), or one 1? oz glass of hard liquor (44 mL). ?Lifestyle ?Brush your teeth every morning and night with fluoride toothpaste. Floss one time each day. ?Exercise for at least 30 minutes 5 or more days each week. ?Do not use any products that contain nicotine or tobacco. These products include cigarettes, chewing tobacco, and vaping devices, such as e-cigarettes. If you need help quitting, ask your health care provider. ?Do not use drugs. ?If you are sexually active, practice safe sex. Use a condom or other form of protection to prevent STIs. ?Find healthy ways to manage stress, such as: ?Meditation, yoga, or listening to music. ?Journaling. ?Talking to a trusted person. ?Spending time with friends and family. ?Minimize exposure to UV radiation to reduce your risk of skin cancer. ?Safety ?Always wear your seat belt while driving or riding in a vehicle. ?Do not drive: ?If you have been drinking alcohol. Do not ride with someone who has been drinking. ?If you have been using any mind-altering substances or   drugs. ?While texting. ?When you are tired or distracted. ?Wear a helmet and other protective equipment during sports activities. ?If you have firearms in your house, make sure you  follow all gun safety procedures. ?Seek help if you have been physically or sexually abused. ?What's next? ?Go to your health care provider once a year for an annual wellness visit. ?Ask your health care provider how often you should have your eyes and teeth checked. ?Stay up to date on all vaccines. ?This information is not intended to replace advice given to you by your health care provider. Make sure you discuss any questions you have with your health care provider. ?Document Revised: 10/06/2020 Document Reviewed: 10/06/2020 ?Elsevier Patient Education ? 2022 Elsevier Inc. ? ?

## 2021-03-09 NOTE — Progress Notes (Signed)
HPI: Eric Wright is a 25 y.o. male who  has a past medical history of Congenital pectus excavatum (07/27/2016), Heart murmur, Mild mitral regurgitation by prior echocardiogram (08/11/2016), Mitral valve prolapse determined by imaging (08/11/2016), Underweight (07/27/2016), and Vasovagal syncope (07/31/2016).  he presents to [facility] today, 03/09/21,  for chief complaint of: Annual physical exam  Dentist: UTD, going today Eye exam: no correction or concerns, no recent exam Exercise: stays active at work Diet: no restrictions COVID vaccine: 1 J&J with reaction, not interested in further boosters  Concerns: None  Past medical, surgical, social and family history reviewed:  Patient Active Problem List   Diagnosis Date Noted   Mitral valve prolapse determined by imaging 08/11/2016   Mild mitral regurgitation by prior echocardiogram 08/11/2016   Abnormal echocardiogram 08/11/2016   Vasovagal syncope 07/31/2016   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   Underweight 07/27/2016   Systolic ejection murmur 07/27/2016   Left wrist pain 02/22/2016   Left wrist tendonitis 02/22/2016    Past Surgical History:  Procedure Laterality Date   NO PAST SURGERIES      Social History   Tobacco Use   Smoking status: Never   Smokeless tobacco: Never  Substance Use Topics   Alcohol use: Yes    Alcohol/week: 1.0 - 2.0 standard drink    Types: 1 - 2 Standard drinks or equivalent per week    Family History  Problem Relation Age of Onset   Asthma Brother    Dementia Maternal Grandmother      Current medication list and allergy/intolerance information reviewed:    Current Outpatient Medications  Medication Sig Dispense Refill   clotrimazole-betamethasone (LOTRISONE) cream Apply 1 application topically 2 (two) times daily. 30 g 0   No current facility-administered medications for this visit.    No Known Allergies    Review of  Systems: Constitutional:  No  fever, no chills, No recent illness, No unintentional weight changes. No significant fatigue.  HEENT: No  headache, no vision change, no hearing change, No sore throat, No  sinus pressure Cardiac: No  chest pain, No  pressure, No palpitations, No  Orthopnea Respiratory:  No  shortness of breath. No  Cough Gastrointestinal: No  abdominal pain, No  nausea, No  vomiting,  No  blood in stool, No  diarrhea, No  constipation  Musculoskeletal: No new myalgia/arthralgia Skin: + circular, slightly scaly, non pruritic left upper arm rash, No other wounds/concerning lesions Genitourinary: No  incontinence, No  abnormal genital bleeding, No abnormal genital discharge Hem/Onc: No  easy bruising/bleeding, No  abnormal lymph node Endocrine: No cold intolerance,  No heat intolerance. No polyuria/polydipsia/polyphagia  Neurologic: No  weakness, No  dizziness, No  slurred speech/focal weakness/facial droop Psychiatric: No  concerns with depression, No  concerns with anxiety, No sleep problems, No mood problems  Exam:  BP 105/70 (BP Location: Right Arm, Patient Position: Sitting, Cuff Size: Normal)   Pulse 80   Resp 20   Ht 5' 11.5" (1.816 m)   Wt 122 lb (55.3 kg)   SpO2 98%   BMI 16.78 kg/m  Constitutional: VS see above. General Appearance: alert, well-developed, well-nourished, NAD Eyes: Normal lids and conjunctive, non-icteric sclera Ears, Nose, Mouth, Throat: MMM, Normal external inspection ears/nares/mouth/lips/gums. TM normal bilaterally.  Neck: No masses, trachea midline. No thyroid enlargement. No tenderness/mass appreciated. No lymphadenopathy Respiratory: Normal respiratory effort. no wheeze, no rhonchi, no rales Cardiovascular: S1/S2 normal, no murmur, no rub/gallop auscultated. RRR. No lower extremity  edema. No carotid bruit or JVD. No abdominal aortic bruit. Gastrointestinal: Nontender, no masses. No hepatomegaly, no splenomegaly. No hernia appreciated. Bowel  sounds normal. Rectal exam deferred.  Musculoskeletal: Gait normal. No clubbing/cyanosis of digits.  Neurological: Normal balance/coordination. No tremor. No cranial nerve deficit on limited exam. Motor and sensation intact and symmetric. Cerebellar reflexes intact.  Skin: warm, dry, intact. No rash/ulcer. No concerning nevi or subq nodules on limited exam.   Psychiatric: Normal judgment/insight. Normal mood and affect. Oriented x3.    ASSESSMENT/PLAN:   1. Annual physical exam Would like to defer labs until next year. Wellness information provided via AVS.   2. Rash Somewhat fungal appearance. Lotrisone BID to the affected area as needed.    No orders of the defined types were placed in this encounter.   Meds ordered this encounter  Medications   clotrimazole-betamethasone (LOTRISONE) cream    Sig: Apply 1 application topically 2 (two) times daily.    Dispense:  30 g    Refill:  0    Order Specific Question:   Supervising Provider    Answer:   Patrcia Dolly    Patient Instructions  Preventive Care 64-66 Years Old, Male Preventive care refers to lifestyle choices and visits with your health care provider that can promote health and wellness. Preventive care visits are also called wellness exams. What can I expect for my preventive care visit? Counseling During your preventive care visit, your health care provider may ask about your: Medical history, including: Past medical problems. Family medical history. Current health, including: Emotional well-being. Home life and relationship well-being. Sexual activity. Lifestyle, including: Alcohol, nicotine or tobacco, and drug use. Access to firearms. Diet, exercise, and sleep habits. Safety issues such as seatbelt and bike helmet use. Sunscreen use. Work and work Astronomer. Physical exam Your health care provider may check your: Height and weight. These may be used to calculate your BMI (body mass index). BMI is  a measurement that tells if you are at a healthy weight. Waist circumference. This measures the distance around your waistline. This measurement also tells if you are at a healthy weight and may help predict your risk of certain diseases, such as type 2 diabetes and high blood pressure. Heart rate and blood pressure. Body temperature. Skin for abnormal spots. What immunizations do I need? Vaccines are usually given at various ages, according to a schedule. Your health care provider will recommend vaccines for you based on your age, medical history, and lifestyle or other factors, such as travel or where you work. What tests do I need? Screening Your health care provider may recommend screening tests for certain conditions. This may include: Lipid and cholesterol levels. Diabetes screening. This is done by checking your blood sugar (glucose) after you have not eaten for a while (fasting). Hepatitis B test. Hepatitis C test. HIV (human immunodeficiency virus) test. STI (sexually transmitted infection) testing, if you are at risk. Talk with your health care provider about your test results, treatment options, and if necessary, the need for more tests. Follow these instructions at home: Eating and drinking  Eat a healthy diet that includes fresh fruits and vegetables, whole grains, lean protein, and low-fat dairy products. Drink enough fluid to keep your urine pale yellow. Take vitamin and mineral supplements as recommended by your health care provider. Do not drink alcohol if your health care provider tells you not to drink. If you drink alcohol: Limit how much you have to 0-2 drinks a day. Know  how much alcohol is in your drink. In the [place] one drink equals one 12 oz bottle of beer (355 mL), one 5 oz glass of wine (148 mL), or one 1 oz glass of hard liquor (44 mL). Lifestyle Brush your teeth every morning and night with fluoride toothpaste. Floss one time each day. Exercise for at least  30 minutes 5 or more days each week. Do not use any products that contain nicotine or tobacco. These products include cigarettes, chewing tobacco, and vaping devices, such as e-cigarettes. If you need help quitting, ask your health care provider. Do not use drugs. If you are sexually active, practice safe sex. Use a condom or other form of protection to prevent STIs. Find healthy ways to manage stress, such as: Meditation, yoga, or listening to music. Journaling. Talking to a trusted person. Spending time with friends and family. Minimize exposure to UV radiation to reduce your risk of skin cancer. Safety Always wear your seat belt while driving or riding in a vehicle. Do not drive: If you have been drinking alcohol. Do not ride with someone who has been drinking. If you have been using any mind-altering substances or drugs. While texting. When you are tired or distracted. Wear a helmet and other protective equipment during sports activities. If you have firearms in your house, make sure you follow all gun safety procedures. Seek help if you have been physically or sexually abused. What's next? Go to your health care provider once a year for an annual wellness visit. Ask your health care provider how often you should have your eyes and teeth checked. Stay up to date on all vaccines. This information is not intended to replace advice given to you by your health care provider. Make sure you discuss any questions you have with your health care provider. Document Revised: 10/06/2020 Document Reviewed: 10/06/2020 Elsevier Patient Education  2022 [employer].   Follow-up plan: Return in about 1 year (around 03/09/2022) for annual physical exam or sooner if needed.  Thayer Ohm, DNP, APRN, FNP-BC [facility]

## 2022-03-12 NOTE — Progress Notes (Unsigned)
   Complete physical exam  Patient: Eric Wright   DOB: [birth year]   26 y.o. Male  MRN: [id number]  Subjective:    No chief complaint on file.   Froylan Hobby is a 26 y.o. male who presents today for a complete physical exam. He reports consuming a {diet types:17450} diet. {types:19826} He generally feels {DESC; WELL/FAIRLY WELL/POORLY:18703}. He reports sleeping {DESC; WELL/FAIRLY WELL/POORLY:18703}. He {does/does not:200015} have additional problems to discuss today.    Most recent fall risk assessment:    03/09/2021    8:40 AM  Fall Risk   Falls in the past year? Exclusion - non ambulatory     Most recent depression screenings:    03/09/2021    8:39 AM 03/08/2020   11:34 AM  PHQ 2/9 Scores  PHQ - 2 Score 0 2  PHQ- 9 Score  2    {VISON DENTAL STD PSA (Optional):27386}  {History (Optional):23778}  Patient Care Team: Christen Butter, NP as PCP - General (Nurse Practitioner)   Outpatient Medications Prior to Visit  Medication Sig   clotrimazole-betamethasone (LOTRISONE) cream Apply 1 application topically 2 (two) times daily.   No facility-administered medications prior to visit.    ROS        Objective:     There were no vitals taken for this visit. {Vitals History (Optional):23777}  Physical Exam   No results found for any visits on 03/13/22. {Show previous labs (optional):23779}    Assessment & Plan:    Routine Health Maintenance and Physical Exam  Immunization History  Administered Date(s) Administered   H1N1 03/04/2008   Tdap 04/16/2013    Health Maintenance  Topic Date Due   Hepatitis C Screening  Completed   HIV Screening  Completed   INFLUENZA VACCINE  Discontinued   HPV VACCINES  Discontinued    Discussed health benefits of physical activity, and encouraged him to engage in regular exercise appropriate for his age and condition.  Problem List Items Addressed This Visit   None Visit Diagnoses     Annual  physical exam    -  Primary      No follow-ups on file.     Christen Butter, NP

## 2022-03-13 ENCOUNTER — Ambulatory Visit (INDEPENDENT_AMBULATORY_CARE_PROVIDER_SITE_OTHER): Payer: Managed Care, Other (non HMO) | Admitting: Medical-Surgical

## 2022-03-13 ENCOUNTER — Encounter: Payer: Self-pay | Admitting: Medical-Surgical

## 2022-03-13 VITALS — BP 112/72 | HR 67 | Ht 71.5 in | Wt 124.0 lb

## 2022-03-13 DIAGNOSIS — Z0000 Encounter for general adult medical examination without abnormal findings: Secondary | ICD-10-CM | POA: Diagnosis not present

## 2022-03-13 DIAGNOSIS — Z13220 Encounter for screening for lipoid disorders: Secondary | ICD-10-CM | POA: Diagnosis not present

## 2023-03-19 ENCOUNTER — Encounter: Payer: Self-pay | Admitting: Medical-Surgical

## 2023-03-28 ENCOUNTER — Ambulatory Visit (INDEPENDENT_AMBULATORY_CARE_PROVIDER_SITE_OTHER): Payer: Managed Care, Other (non HMO) | Admitting: Medical-Surgical

## 2023-03-28 VITALS — BP 109/72 | HR 79 | Resp 20 | Ht 71.5 in | Wt 126.0 lb

## 2023-03-28 DIAGNOSIS — Z23 Encounter for immunization: Secondary | ICD-10-CM

## 2023-03-28 DIAGNOSIS — Z13220 Encounter for screening for lipoid disorders: Secondary | ICD-10-CM

## 2023-03-28 DIAGNOSIS — F329 Major depressive disorder, single episode, unspecified: Secondary | ICD-10-CM

## 2023-03-28 DIAGNOSIS — Z0000 Encounter for general adult medical examination without abnormal findings: Secondary | ICD-10-CM | POA: Diagnosis not present

## 2023-03-28 MED ORDER — BUPROPION HCL ER (XL) 150 MG PO TB24: 150.0000 mg | ORAL_TABLET | Freq: Every day | ORAL | 1 refills | Status: DC

## 2023-03-28 NOTE — Patient Instructions (Signed)
Preventive Care 21-27 Years Old, Male Preventive care refers to lifestyle choices and visits with your health care provider that can promote health and wellness. Preventive care visits are also called wellness exams. What can I expect for my preventive care visit? Counseling During your preventive care visit, your health care provider may ask about your: Medical history, including: Past medical problems. Family medical history. Current health, including: Emotional well-being. Home life and relationship well-being. Sexual activity. Lifestyle, including: Alcohol, nicotine or tobacco, and drug use. Access to firearms. Diet, exercise, and sleep habits. Safety issues such as seatbelt and bike helmet use. Sunscreen use. Work and work environment. Physical exam Your health care provider may check your: Height and weight. These may be used to calculate your BMI (body mass index). BMI is a measurement that tells if you are at a healthy weight. Waist circumference. This measures the distance around your waistline. This measurement also tells if you are at a healthy weight and may help predict your risk of certain diseases, such as type 2 diabetes and high blood pressure. Heart rate and blood pressure. Body temperature. Skin for abnormal spots. What immunizations do I need?  Vaccines are usually given at various ages, according to a schedule. Your health care provider will recommend vaccines for you based on your age, medical history, and lifestyle or other factors, such as travel or where you work. What tests do I need? Screening Your health care provider may recommend screening tests for certain conditions. This may include: Lipid and cholesterol levels. Diabetes screening. This is done by checking your blood sugar (glucose) after you have not eaten for a while (fasting). Hepatitis B test. Hepatitis C test. HIV (human immunodeficiency virus) test. STI (sexually transmitted infection)  testing, if you are at risk. Talk with your health care provider about your test results, treatment options, and if necessary, the need for more tests. Follow these instructions at home: Eating and drinking  Eat a healthy diet that includes fresh fruits and vegetables, whole grains, lean protein, and low-fat dairy products. Drink enough fluid to keep your urine pale yellow. Take vitamin and mineral supplements as recommended by your health care provider. Do not drink alcohol if your health care provider tells you not to drink. If you drink alcohol: Limit how much you have to 0-2 drinks a day. Know how much alcohol is in your drink. In the [place] one drink equals one 12 oz bottle of beer (355 mL), one 5 oz glass of wine (148 mL), or one 1 oz glass of hard liquor (44 mL). Lifestyle Brush your teeth every morning and night with fluoride toothpaste. Floss one time each day. Exercise for at least 30 minutes 5 or more days each week. Do not use any products that contain nicotine or tobacco. These products include cigarettes, chewing tobacco, and vaping devices, such as e-cigarettes. If you need help quitting, ask your health care provider. Do not use drugs. If you are sexually active, practice safe sex. Use a condom or other form of protection to prevent STIs. Find healthy ways to manage stress, such as: Meditation, yoga, or listening to music. Journaling. Talking to a trusted person. Spending time with friends and family. Minimize exposure to UV radiation to reduce your risk of skin cancer. Safety Always wear your seat belt while driving or riding in a vehicle. Do not drive: If you have been drinking alcohol. Do not ride with someone who has been drinking. If you have been using any mind-altering substances   or drugs. While texting. When you are tired or distracted. Wear a helmet and other protective equipment during sports activities. If you have firearms in your house, make sure you  follow all gun safety procedures. Seek help if you have been physically or sexually abused. What's next? Go to your health care provider once a year for an annual wellness visit. Ask your health care provider how often you should have your eyes and teeth checked. Stay up to date on all vaccines. This information is not intended to replace advice given to you by your health care provider. Make sure you discuss any questions you have with your health care provider. Document Revised: 10/06/2020 Document Reviewed: 10/06/2020 Elsevier Patient Education  2024 Elsevier Inc.  

## 2023-03-28 NOTE — Progress Notes (Signed)
Complete physical exam  Patient: Eric Wright   DOB: 1995-07-05   27 y.o. Male  MRN: [id number]  Subjective:    Chief Complaint  Patient presents with   Annual Exam    Eric Wright is a 27 y.o. male who presents today for a complete physical exam. He reports consuming a general diet.  Stays physically active at work.  He generally feels well. He reports sleeping well. He does have additional problems to discuss today.    Most recent fall risk assessment:    03/28/2023   10:03 AM  Fall Risk   Falls in the past year? 0  Number falls in past yr: 0  Injury with Fall? 0  Risk for fall due to : No Fall Risks  Follow up Falls evaluation completed     Most recent depression screenings:    03/28/2023   10:24 AM 03/28/2023   10:04 AM  PHQ 2/9 Scores  PHQ - 2 Score 3 0  PHQ- 9 Score 8     Vision:Not within last year  and Dental: No current dental problems and Receives regular dental care    Patient Care Team: Christen Butter, NP as PCP - General (Nurse Practitioner)   No outpatient medications prior to visit.   No facility-administered medications prior to visit.    Review of Systems  Constitutional:  Negative for chills, fever, malaise/fatigue and weight loss.  HENT:  Negative for congestion, ear pain, hearing loss, sinus pain and sore throat.   Eyes:  Negative for blurred vision, photophobia and pain.  Respiratory:  Negative for cough, shortness of breath and wheezing.   Cardiovascular:  Negative for chest pain, palpitations and leg swelling.  Gastrointestinal:  Negative for abdominal pain, constipation, diarrhea, heartburn, nausea and vomiting.  Genitourinary:  Negative for dysuria, frequency and urgency.  Musculoskeletal:  Negative for falls and neck pain.  Skin:  Negative for itching and rash.  Neurological:  Negative for dizziness, weakness and headaches.  Endo/Heme/Allergies:  Negative for polydipsia. Does not bruise/bleed easily.   Psychiatric/Behavioral:  Positive for depression. Negative for substance abuse and suicidal ideas. The patient is not nervous/anxious and does not have insomnia.      Objective:     BP 109/72 (BP Location: Right Arm, Cuff Size: Normal)   Pulse 79   Resp 20   Ht 5' 11.5" (1.816 m)   Wt 126 lb (57.2 kg)   SpO2 98%   BMI 17.33 kg/m    Physical Exam Constitutional:      General: He is not in acute distress.    Appearance: Normal appearance. He is not ill-appearing.  HENT:     Head: Normocephalic and atraumatic.     Right Ear: Tympanic membrane, ear canal and external ear normal. There is no impacted cerumen.     Left Ear: Tympanic membrane, ear canal and external ear normal. There is no impacted cerumen.     Nose: Nose normal. No congestion or rhinorrhea.     Mouth/Throat:     Mouth: Mucous membranes are moist.     Pharynx: No oropharyngeal exudate or posterior oropharyngeal erythema.  Eyes:     General: No scleral icterus.       Right eye: No discharge.        Left eye: No discharge.     Extraocular Movements: Extraocular movements intact.     Conjunctiva/sclera: Conjunctivae normal.     Pupils: Pupils are equal, round, and reactive to light.  Neck:  Thyroid: No thyromegaly.     Vascular: No carotid bruit or JVD.     Trachea: Trachea normal.  Cardiovascular:     Rate and Rhythm: Normal rate and regular rhythm.     Pulses: Normal pulses.     Heart sounds: Normal heart sounds. No murmur heard.    No friction rub. No gallop.  Pulmonary:     Effort: Pulmonary effort is normal. No respiratory distress.     Breath sounds: Normal breath sounds. No wheezing.  Abdominal:     General: Bowel sounds are normal. There is no distension.     Palpations: Abdomen is soft.     Tenderness: There is no abdominal tenderness. There is no guarding.  Musculoskeletal:        General: Normal range of motion.     Cervical back: Normal range of motion and neck supple.  Lymphadenopathy:      Cervical: No cervical adenopathy.  Skin:    General: Skin is warm and dry.  Neurological:     Mental Status: He is alert and oriented to person, place, and time.     Cranial Nerves: No cranial nerve deficit.  Psychiatric:        Mood and Affect: Mood normal.        Behavior: Behavior normal.        Thought Content: Thought content normal.        Judgment: Judgment normal.      No results found for any visits on 03/28/23.     Assessment & Plan:    Routine Health Maintenance and Physical Exam  Immunization History  Administered Date(s) Administered   H1N1 03/04/2008   Influenza, Seasonal, Injecte, Preservative Fre 03/28/2023   Tdap 04/16/2013    Health Maintenance  Topic Date Due   COVID-19 Vaccine (1 - 2023-24 season) 04/13/2023 (Originally 12/24/2022)   DTaP/Tdap/Td (2 - Td or Tdap) 04/17/2023   INFLUENZA VACCINE  Completed   Hepatitis C Screening  Completed   HIV Screening  Completed   HPV VACCINES  Discontinued    Discussed health benefits of physical activity, and encouraged him to engage in regular exercise appropriate for his age and condition.  1. Annual physical exam Checking labs as below.  Wellness information provided with AVS. - CBC with Differential/Platelet - Lipid panel - CMP14+EGFR  2. Screening for lipid disorders Checking lipids. - Lipid panel  3. Need for influenza vaccination Flu vaccine given in office. - Flu vaccine trivalent PF, 6mos and older(Flulaval,Afluria,Fluarix,Fluzone)  4. Reactive depression Over the last year, has had a break-up with a serious relationship and has been struggling.  Feels that he is exhausted all the time and has no motivation.  Discussed options including counseling and medication.  He has a number that we will get him counseling through his employer and plans to reach out to them.  Adding Wellbutrin 150 mg daily to allow him to try it for the next few weeks and see if this is beneficial.   Return in about 1  year (around 03/27/2024) for annual physical exam.     Christen Butter, NP

## 2023-03-29 LAB — CMP14+EGFR
ALT: 13 [IU]/L (ref 0–44)
AST: 17 [IU]/L (ref 0–40)
Albumin: 4.4 g/dL (ref 4.3–5.2)
Alkaline Phosphatase: 60 [IU]/L (ref 44–121)
BUN/Creatinine Ratio: 14 (ref 9–20)
BUN: 12 mg/dL (ref 6–20)
Bilirubin Total: 0.7 mg/dL (ref 0.0–1.2)
CO2: 23 mmol/L (ref 20–29)
Calcium: 9.4 mg/dL (ref 8.7–10.2)
Chloride: 101 mmol/L (ref 96–106)
Creatinine, Ser: 0.83 mg/dL (ref 0.76–1.27)
Globulin, Total: 2.9 g/dL (ref 1.5–4.5)
Glucose: 84 mg/dL (ref 70–99)
Potassium: 4.5 mmol/L (ref 3.5–5.2)
Sodium: 139 mmol/L (ref 134–144)
Total Protein: 7.3 g/dL (ref 6.0–8.5)
eGFR: 123 mL/min/{1.73_m2} (ref >59)

## 2023-03-29 LAB — CBC WITH DIFFERENTIAL/PLATELET
Basophils Absolute: 0 10*3/uL (ref 0.0–0.2)
Basos: 0 %
EOS (ABSOLUTE): 0.1 10*3/uL (ref 0.0–0.4)
Eos: 2 %
Hematocrit: 45.2 % (ref 37.5–51.0)
Hemoglobin: 15.5 g/dL (ref 13.0–17.7)
Immature Grans (Abs): 0 10*3/uL (ref 0.0–0.1)
Immature Granulocytes: 0 %
Lymphocytes Absolute: 1.7 10*3/uL (ref 0.7–3.1)
Lymphs: 25 %
MCH: 30.6 pg (ref 26.6–33.0)
MCHC: 34.3 g/dL (ref 31.5–35.7)
MCV: 89 fL (ref 79–97)
Monocytes Absolute: 0.8 10*3/uL (ref 0.1–0.9)
Monocytes: 11 %
Neutrophils Absolute: 4.1 10*3/uL (ref 1.4–7.0)
Neutrophils: 62 %
Platelets: 275 10*3/uL (ref 150–450)
RBC: 5.07 x10E6/uL (ref 4.14–5.80)
RDW: 11.8 % (ref 11.6–15.4)
WBC: 6.8 10*3/uL (ref 3.4–10.8)

## 2023-03-29 LAB — LIPID PANEL
Chol/HDL Ratio: 2.3 {ratio} (ref 0.0–5.0)
Cholesterol, Total: 119 mg/dL (ref 100–199)
HDL: 52 mg/dL (ref >39)
LDL Chol Calc (NIH): 52 mg/dL (ref 0–99)
Triglycerides: 73 mg/dL (ref 0–149)
VLDL Cholesterol Cal: 15 mg/dL (ref 5–40)

## 2023-04-21 ENCOUNTER — Other Ambulatory Visit: Payer: Self-pay | Admitting: Medical-Surgical

## 2023-05-26 ENCOUNTER — Other Ambulatory Visit: Payer: Self-pay | Admitting: Medical-Surgical

## 2024-03-28 ENCOUNTER — Encounter: Payer: Self-pay | Admitting: Medical-Surgical

## 2024-03-28 ENCOUNTER — Ambulatory Visit: Payer: Managed Care, Other (non HMO) | Admitting: Medical-Surgical

## 2024-03-28 VITALS — BP 103/64 | HR 69 | Resp 20 | Ht 71.5 in | Wt 125.4 lb

## 2024-03-28 DIAGNOSIS — L989 Disorder of the skin and subcutaneous tissue, unspecified: Secondary | ICD-10-CM | POA: Diagnosis not present

## 2024-03-28 DIAGNOSIS — Z0000 Encounter for general adult medical examination without abnormal findings: Secondary | ICD-10-CM | POA: Diagnosis not present

## 2024-03-28 NOTE — Progress Notes (Signed)
 Complete physical exam  Patient: Eric Wright   DOB: Apr 22, 1996   28 y.o. Male  MRN: [id number]  Subjective:    No chief complaint on file.   Eric Wright is a 28 y.o. male who presents today for a complete physical exam. He reports consuming a general diet. Home exercise routine includes yoga and about once per week. He generally feels well. He reports sleeping well. He does not have additional problems to discuss today.    Most recent fall risk assessment:    03/28/2023   10:03 AM  Fall Risk   Falls in the past year? 0  Number falls in past yr: 0  Injury with Fall? 0   Risk for fall due to : No Fall Risks  Follow up Falls evaluation completed     Data saved with a previous flowsheet row definition     Most recent depression screenings:    03/28/2023   10:24 AM 03/28/2023   10:04 AM  PHQ 2/9 Scores  PHQ - 2 Score 3 0  PHQ- 9 Score 8       Data saved with a previous flowsheet row definition    Vision:Not within last year  and Dental: No current dental problems  Patient Active Problem List   Diagnosis Date Noted   Mitral valve prolapse determined by imaging 08/11/2016   Mild mitral regurgitation by prior echocardiogram 08/11/2016   Abnormal echocardiogram 08/11/2016   Vasovagal syncope 07/31/2016   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   Underweight 07/27/2016   Systolic ejection murmur 07/27/2016   Past Medical History:  Diagnosis Date   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   Heart murmur    Mild mitral regurgitation by prior echocardiogram 08/11/2016   Mitral valve prolapse determined by imaging 08/11/2016   Underweight 07/27/2016   Vasovagal syncope 07/31/2016   Past Surgical History:  Procedure Laterality Date   NO PAST SURGERIES     Social History   Tobacco Use   Smoking status: Never   Smokeless tobacco: Never   Tobacco comments:    Has tobacco pipe and uses occ about once a month  Vaping Use   Vaping status:  Never Used  Substance Use Topics   Alcohol use: Yes    Alcohol/week: 1.0 - 2.0 standard drink of alcohol    Types: 1 - 2 Standard drinks or equivalent per week    Comment: occ 1-2 times per month   Drug use: No   Social History   Socioeconomic History   Marital status: Single    Spouse name: Not on file   Number of children: Not on file   Years of education: Not on file   Highest education level: Associate degree: occupational, scientist, product/process development, or vocational program  Occupational History    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  Tobacco Use   Smoking status: Never   Smokeless tobacco: Never   Tobacco comments:    Has tobacco pipe and uses occ about once a month  Vaping Use   Vaping status: Never Used  Substance and Sexual Activity   Alcohol use: Yes    Alcohol/week: 1.0 - 2.0 standard drink of alcohol    Types: 1 - 2 Standard drinks or equivalent per week    Comment: occ 1-2 times per month   Drug use: No   Sexual activity: Not Currently    Partners: Female    Birth control/protection: Condom, None  Other Topics Concern   Not on file  Social History  Narrative   Not on file   Social Drivers of Health   Financial Resource Strain: Low Risk  (03/28/2023)   Overall Financial Resource Strain (CARDIA)    Difficulty of Paying Living Expenses: Not very hard  Food Insecurity: No Food Insecurity (03/28/2023)   Hunger Vital Sign    Worried About Running Out of Food in the Last Year: Never true    Ran Out of Food in the Last Year: Never true  Transportation Needs: No Transportation Needs (03/28/2023)   PRAPARE - Administrator, Civil Service (Medical): No    Lack of Transportation (Non-Medical): No  Physical Activity: Sufficiently Active (03/28/2023)   Exercise Vital Sign    Days of Exercise per Week: 7 days    Minutes of Exercise per Session: 80 min  Stress: Stress Concern Present (03/28/2023)   [employer] of Occupational Health - Occupational Stress Questionnaire     Feeling of Stress : To some extent  Social Connections: Moderately Isolated (03/28/2023)   Social Connection and Isolation Panel    Frequency of Communication with Friends and Family: More than three times a week    Frequency of Social Gatherings with Friends and Family: Three times a week    Attends Religious Services: More than 4 times per year    Active Member of Clubs or Organizations: No    Attends Engineer, Structural: Not on file    Marital Status: Never married  Catering Manager Violence: Not on file   Family Status  Relation Name Status   Mother  Alive   Father  Alive   Brother  Alive   MGM  Alive   MGF  Alive   PGM  Alive   PGF  Alive  No partnership data on file   Family History  Problem Relation Age of Onset   Asthma Brother    Dementia Maternal Grandmother    No Known Allergies    Patient Care Team: Willo Mini, NP as PCP - General (Nurse Practitioner)   Outpatient Medications Prior to Visit  Medication Sig   buPROPion  (WELLBUTRIN  XL) 150 MG 24 hr tablet TAKE 1 TABLET BY MOUTH EVERY DAY   No facility-administered medications prior to visit.    Review of Systems  Constitutional: Negative.   HENT: Negative.    Eyes: Negative.   Respiratory: Negative.    Cardiovascular: Negative.   Gastrointestinal: Negative.   Genitourinary: Negative.   Musculoskeletal: Negative.   Skin: Negative.   Neurological: Negative.   Endo/Heme/Allergies: Negative.   Psychiatric/Behavioral: Negative.        Objective:     There were no vitals taken for this visit. BP Readings from Last 3 Encounters:  03/28/23 109/72  03/13/22 112/72  03/09/21 105/70   Wt Readings from Last 3 Encounters:  03/28/23 57.2 kg  03/13/22 56.2 kg  03/09/21 55.3 kg      Physical Exam Vitals and nursing note reviewed.  Constitutional:      General: He is not in acute distress.    Appearance: Normal appearance.  HENT:     Head: Normocephalic.     Right Ear: Tympanic  membrane, ear canal and external ear normal. There is no impacted cerumen.     Left Ear: Tympanic membrane, ear canal and external ear normal. There is no impacted cerumen.     Nose: Nose normal.     Mouth/Throat:     Mouth: Mucous membranes are moist.     Pharynx: Oropharynx is clear.  Eyes:     Conjunctiva/sclera: Conjunctivae normal.     Pupils: Pupils are equal, round, and reactive to light.  Cardiovascular:     Rate and Rhythm: Normal rate and regular rhythm.     Pulses: Normal pulses.     Heart sounds: Normal heart sounds.  Pulmonary:     Effort: Pulmonary effort is normal.     Breath sounds: Normal breath sounds.  Abdominal:     General: Bowel sounds are normal.     Palpations: Abdomen is soft.  Musculoskeletal:        General: Normal range of motion.     Cervical back: Normal range of motion.  Skin:    General: Skin is warm and dry.  Neurological:     General: No focal deficit present.     Mental Status: He is alert and oriented to person, place, and time.  Psychiatric:        Mood and Affect: Mood normal.        Behavior: Behavior normal.        Thought Content: Thought content normal.        Judgment: Judgment normal.      No results found for any visits on 03/28/24. Last CBC Lab Results  Component Value Date   WBC 6.8 03/28/2023   HGB 15.5 03/28/2023   HCT 45.2 03/28/2023   MCV 89 03/28/2023   MCH 30.6 03/28/2023   RDW 11.8 03/28/2023   PLT 275 03/28/2023   Last metabolic panel Lab Results  Component Value Date   GLUCOSE 84 03/28/2023   NA 139 03/28/2023   K 4.5 03/28/2023   CL 101 03/28/2023   CO2 23 03/28/2023   BUN 12 03/28/2023   CREATININE 0.83 03/28/2023   EGFR 123 03/28/2023   CALCIUM 9.4 03/28/2023   PROT 7.3 03/28/2023   ALBUMIN 4.4 03/28/2023   LABGLOB 2.9 03/28/2023   BILITOT 0.7 03/28/2023   ALKPHOS 60 03/28/2023   AST 17 03/28/2023   ALT 13 03/28/2023   Last lipids Lab Results  Component Value Date   CHOL 119 03/28/2023    HDL 52 03/28/2023   LDLCALC 52 03/28/2023   TRIG 73 03/28/2023   CHOLHDL 2.3 03/28/2023        Assessment & Plan:    Routine Health Maintenance and Physical Exam  Immunization History  Administered Date(s) Administered   H1N1 03/04/2008   Influenza, Seasonal, Injecte, Preservative Fre 03/28/2023   Tdap 04/16/2013    Health Maintenance  Topic Date Due   Pneumococcal Vaccine (1 of 2 - PCV) Never done   Hepatitis B Vaccines 19-59 Average Risk (1 of 3 - 19+ 3-dose series) Never done   DTaP/Tdap/Td (2 - Td or Tdap) 04/17/2023   Influenza Vaccine  11/23/2023   COVID-19 Vaccine (1 - 2025-26 season) Never done   Hepatitis C Screening  Completed   HIV Screening  Completed   Meningococcal B Vaccine  Aged Out   HPV VACCINES  Discontinued    Discussed health benefits of physical activity, and encouraged him to engage in regular exercise appropriate for his age and condition.  1. Annual physical exam (Primary) -Baseline labs checked today -CBC -CMP   Return in about 1 year (around 03/28/2025) for Annual.     Derrek JINNY Freund, NP Student

## 2024-03-28 NOTE — Patient Instructions (Signed)
 Preventive Care 11-28 Years Old, Male Preventive care refers to lifestyle choices and visits with your health care provider that can promote health and wellness. Preventive care visits are also called wellness exams. What can I expect for my preventive care visit? Counseling During your preventive care visit, your health care provider may ask about your: Medical history, including: Past medical problems. Family medical history. Current health, including: Emotional well-being. Home life and relationship well-being. Sexual activity. Lifestyle, including: Alcohol, nicotine or tobacco, and drug use. Access to firearms. Diet, exercise, and sleep habits. Safety issues such as seatbelt and bike helmet use. Sunscreen use. Work and work Astronomer. Physical exam Your health care provider may check your: Height and weight. These may be used to calculate your BMI (body mass index). BMI is a measurement that tells if you are at a healthy weight. Waist circumference. This measures the distance around your waistline. This measurement also tells if you are at a healthy weight and may help predict your risk of certain diseases, such as type 2 diabetes and high blood pressure. Heart rate and blood pressure. Body temperature. Skin for abnormal spots. What immunizations do I need?  Vaccines are usually given at various ages, according to a schedule. Your health care provider will recommend vaccines for you based on your age, medical history, and lifestyle or other factors, such as travel or where you work. What tests do I need? Screening Your health care provider may recommend screening tests for certain conditions. This may include: Lipid and cholesterol levels. Diabetes screening. This is done by checking your blood sugar (glucose) after you have not eaten for a while (fasting). Hepatitis B test. Hepatitis C test. HIV (human immunodeficiency virus) test. STI (sexually transmitted infection)  testing, if you are at risk. Talk with your health care provider about your test results, treatment options, and if necessary, the need for more tests. Follow these instructions at home: Eating and drinking  Eat a healthy diet that includes fresh fruits and vegetables, whole grains, lean protein, and low-fat dairy products. Drink enough fluid to keep your urine pale yellow. Take vitamin and mineral supplements as recommended by your health care provider. Do not drink alcohol if your health care provider tells you not to drink. If you drink alcohol: Limit how much you have to 0-2 drinks a day. Know how much alcohol is in your drink. In the [place] one drink equals one 12 oz bottle of beer (355 mL), one 5 oz glass of wine (148 mL), or one 1 oz glass of hard liquor (44 mL). Lifestyle Brush your teeth every morning and night with fluoride toothpaste. Floss one time each day. Exercise for at least 30 minutes 5 or more days each week. Do not use any products that contain nicotine or tobacco. These products include cigarettes, chewing tobacco, and vaping devices, such as e-cigarettes. If you need help quitting, ask your health care provider. Do not use drugs. If you are sexually active, practice safe sex. Use a condom or other form of protection to prevent STIs. Find healthy ways to manage stress, such as: Meditation, yoga, or listening to music. Journaling. Talking to a trusted person. Spending time with friends and family. Minimize exposure to UV radiation to reduce your risk of skin cancer. Safety Always wear your seat belt while driving or riding in a vehicle. Do not drive: If you have been drinking alcohol. Do not ride with someone who has been drinking. If you have been using any mind-altering substances  or drugs. While texting. When you are tired or distracted. Wear a helmet and other protective equipment during sports activities. If you have firearms in your house, make sure you  follow all gun safety procedures. Seek help if you have been physically or sexually abused. What's next? Go to your health care provider once a year for an annual wellness visit. Ask your health care provider how often you should have your eyes and teeth checked. Stay up to date on all vaccines. This information is not intended to replace advice given to you by your health care provider. Make sure you discuss any questions you have with your health care provider. Document Revised: 10/06/2020 Document Reviewed: 10/06/2020 Elsevier Patient Education  2024 [employer].

## 2024-03-29 LAB — CBC WITH DIFFERENTIAL/PLATELET
Basophils Absolute: 0 x10E3/uL (ref 0.0–0.2)
Basos: 1 %
EOS (ABSOLUTE): 0.1 x10E3/uL (ref 0.0–0.4)
Eos: 2 %
Hematocrit: 46.6 % (ref 37.5–51.0)
Hemoglobin: 15.6 g/dL (ref 13.0–17.7)
Immature Grans (Abs): 0 x10E3/uL (ref 0.0–0.1)
Immature Granulocytes: 0 %
Lymphocytes Absolute: 2.1 x10E3/uL (ref 0.7–3.1)
Lymphs: 33 %
MCH: 30.5 pg (ref 26.6–33.0)
MCHC: 33.5 g/dL (ref 31.5–35.7)
MCV: 91 fL (ref 79–97)
Monocytes Absolute: 0.7 x10E3/uL (ref 0.1–0.9)
Monocytes: 11 %
Neutrophils Absolute: 3.5 x10E3/uL (ref 1.4–7.0)
Neutrophils: 53 %
Platelets: 268 x10E3/uL (ref 150–450)
RBC: 5.11 x10E6/uL (ref 4.14–5.80)
RDW: 12.3 % (ref 11.6–15.4)
WBC: 6.6 x10E3/uL (ref 3.4–10.8)

## 2024-03-29 LAB — CMP14+EGFR
ALT: 14 IU/L (ref 0–44)
AST: 21 IU/L (ref 0–40)
Albumin: 4.6 g/dL (ref 4.3–5.2)
Alkaline Phosphatase: 53 IU/L (ref 47–123)
BUN/Creatinine Ratio: 17 (ref 9–20)
BUN: 13 mg/dL (ref 6–20)
Bilirubin Total: 0.5 mg/dL (ref 0.0–1.2)
CO2: 26 mmol/L (ref 20–29)
Calcium: 9.4 mg/dL (ref 8.7–10.2)
Chloride: 105 mmol/L (ref 96–106)
Creatinine, Ser: 0.77 mg/dL (ref 0.76–1.27)
Globulin, Total: 2.8 g/dL (ref 1.5–4.5)
Glucose: 79 mg/dL (ref 70–99)
Potassium: 4.5 mmol/L (ref 3.5–5.2)
Sodium: 143 mmol/L (ref 134–144)
Total Protein: 7.4 g/dL (ref 6.0–8.5)
eGFR: 125 mL/min/1.73 (ref >59)

## 2024-03-30 ENCOUNTER — Ambulatory Visit: Payer: Self-pay | Admitting: Medical-Surgical

## 2024-03-30 NOTE — Progress Notes (Signed)
 Patient concerned about a skin lesion on the right side of the thoracic back. On evaluation the lesion is asymmetrical, flat, brown with slight mottling of color, slightly irregular border. No erythema, edema, fluctuance, or drainage. Size is approximately 0.28mm x 0.34mm. Advised that the lesion does have some characteristics that would warrant further evaluation. Reviewed shave biopsy vs cryotherapy. Patient to come back to have shave biopsy completed for definitive diagnosis.   Medical screening examination/treatment was performed by qualified clinical staff member and as supervising provider I was immediately available for consultation/collaboration. I have reviewed documentation and agree with assessment and plan.  Zada FREDRIK Palin, DNP, APRN, FNP-BC [facility]

## 2025-03-30 ENCOUNTER — Encounter: Admitting: Medical-Surgical
